# Patient Record
Sex: Female | Born: 1945 | Race: Black or African American | Hispanic: No | Smoking: Never smoker
Health system: Southern US, Community
[De-identification: ages and names within clinical notes are randomized; demographics above are authoritative.]

## PROBLEM LIST (undated history)

## (undated) DIAGNOSIS — C50411 Malignant neoplasm of upper-outer quadrant of right female breast: Principal | ICD-10-CM

## (undated) DIAGNOSIS — R232 Flushing: Secondary | ICD-10-CM

## (undated) DIAGNOSIS — I10 Essential (primary) hypertension: Secondary | ICD-10-CM

## (undated) DIAGNOSIS — T148XXA Other injury of unspecified body region, initial encounter: Secondary | ICD-10-CM

## (undated) DIAGNOSIS — E039 Hypothyroidism, unspecified: Secondary | ICD-10-CM

## (undated) DIAGNOSIS — E079 Disorder of thyroid, unspecified: Secondary | ICD-10-CM

## (undated) DIAGNOSIS — R112 Nausea with vomiting, unspecified: Secondary | ICD-10-CM

## (undated) DIAGNOSIS — K219 Gastro-esophageal reflux disease without esophagitis: Secondary | ICD-10-CM

## (undated) DIAGNOSIS — Z9889 Other specified postprocedural states: Secondary | ICD-10-CM

## (undated) DIAGNOSIS — C50919 Malignant neoplasm of unspecified site of unspecified female breast: Secondary | ICD-10-CM

## (undated) DIAGNOSIS — M1990 Unspecified osteoarthritis, unspecified site: Secondary | ICD-10-CM

## (undated) DIAGNOSIS — D219 Benign neoplasm of connective and other soft tissue, unspecified: Secondary | ICD-10-CM

## (undated) DIAGNOSIS — K5190 Ulcerative colitis, unspecified, without complications: Secondary | ICD-10-CM

## (undated) HISTORY — DX: Essential (primary) hypertension: I10

## (undated) HISTORY — DX: Flushing: R23.2

## (undated) HISTORY — DX: Unspecified osteoarthritis, unspecified site: M19.90

## (undated) HISTORY — DX: Malignant neoplasm of upper-outer quadrant of right female breast: C50.411

## (undated) HISTORY — DX: Malignant neoplasm of unspecified site of unspecified female breast: C50.919

## (undated) HISTORY — DX: Ulcerative colitis, unspecified, without complications: K51.90

## (undated) HISTORY — PX: OTHER SURGICAL HISTORY: SHX169

## (undated) HISTORY — DX: Disorder of thyroid, unspecified: E07.9

## (undated) HISTORY — DX: Other injury of unspecified body region, initial encounter: T14.8XXA

## (undated) HISTORY — PX: KNEE SURGERY: SHX244

## (undated) HISTORY — DX: Benign neoplasm of connective and other soft tissue, unspecified: D21.9

---

## 1992-05-26 HISTORY — PX: HYSTEROSCOPY: SHX211

## 1992-05-26 HISTORY — PX: DILATION AND CURETTAGE OF UTERUS: SHX78

## 1992-05-26 HISTORY — PX: VAGINAL HYSTERECTOMY: SUR661

## 1998-10-11 ENCOUNTER — Other Ambulatory Visit: Admission: RE | Admit: 1998-10-11 | Discharge: 1998-10-11 | Payer: Self-pay | Admitting: Obstetrics and Gynecology

## 1999-12-06 ENCOUNTER — Other Ambulatory Visit: Admission: RE | Admit: 1999-12-06 | Discharge: 1999-12-06 | Payer: Self-pay | Admitting: Obstetrics and Gynecology

## 2001-02-11 ENCOUNTER — Other Ambulatory Visit: Admission: RE | Admit: 2001-02-11 | Discharge: 2001-02-11 | Payer: Self-pay | Admitting: Obstetrics and Gynecology

## 2002-02-11 ENCOUNTER — Other Ambulatory Visit: Admission: RE | Admit: 2002-02-11 | Discharge: 2002-02-11 | Payer: Self-pay | Admitting: Obstetrics and Gynecology

## 2003-02-21 ENCOUNTER — Other Ambulatory Visit: Admission: RE | Admit: 2003-02-21 | Discharge: 2003-02-21 | Payer: Self-pay | Admitting: Obstetrics and Gynecology

## 2003-03-26 ENCOUNTER — Emergency Department (HOSPITAL_COMMUNITY): Admission: EM | Admit: 2003-03-26 | Discharge: 2003-03-27 | Payer: Self-pay | Admitting: Emergency Medicine

## 2003-09-05 ENCOUNTER — Ambulatory Visit (HOSPITAL_COMMUNITY): Admission: RE | Admit: 2003-09-05 | Discharge: 2003-09-05 | Payer: Self-pay | Admitting: Gastroenterology

## 2004-03-04 ENCOUNTER — Other Ambulatory Visit: Admission: RE | Admit: 2004-03-04 | Discharge: 2004-03-04 | Payer: Self-pay | Admitting: Obstetrics and Gynecology

## 2005-03-10 ENCOUNTER — Other Ambulatory Visit: Admission: RE | Admit: 2005-03-10 | Discharge: 2005-03-10 | Payer: Self-pay | Admitting: Obstetrics and Gynecology

## 2006-04-15 ENCOUNTER — Other Ambulatory Visit: Admission: RE | Admit: 2006-04-15 | Discharge: 2006-04-15 | Payer: Self-pay | Admitting: Obstetrics and Gynecology

## 2006-07-31 ENCOUNTER — Ambulatory Visit: Payer: Self-pay | Admitting: Vascular Surgery

## 2006-08-03 ENCOUNTER — Ambulatory Visit: Payer: Self-pay | Admitting: Vascular Surgery

## 2007-05-17 ENCOUNTER — Other Ambulatory Visit: Admission: RE | Admit: 2007-05-17 | Discharge: 2007-05-17 | Payer: Self-pay | Admitting: Obstetrics and Gynecology

## 2008-05-22 ENCOUNTER — Other Ambulatory Visit: Admission: RE | Admit: 2008-05-22 | Discharge: 2008-05-22 | Payer: Self-pay | Admitting: Obstetrics and Gynecology

## 2008-05-22 ENCOUNTER — Ambulatory Visit: Payer: Self-pay | Admitting: Obstetrics and Gynecology

## 2008-05-22 ENCOUNTER — Encounter: Payer: Self-pay | Admitting: Obstetrics and Gynecology

## 2008-05-26 DIAGNOSIS — T148XXA Other injury of unspecified body region, initial encounter: Secondary | ICD-10-CM

## 2008-05-26 HISTORY — DX: Other injury of unspecified body region, initial encounter: T14.8XXA

## 2008-08-01 ENCOUNTER — Encounter: Admission: RE | Admit: 2008-08-01 | Discharge: 2008-08-01 | Payer: Self-pay | Admitting: Emergency Medicine

## 2008-08-03 ENCOUNTER — Encounter: Admission: RE | Admit: 2008-08-03 | Discharge: 2008-08-03 | Payer: Self-pay | Admitting: Emergency Medicine

## 2009-04-23 ENCOUNTER — Encounter: Admission: RE | Admit: 2009-04-23 | Discharge: 2009-04-23 | Payer: Self-pay | Admitting: Pediatrics

## 2009-07-26 ENCOUNTER — Inpatient Hospital Stay (HOSPITAL_COMMUNITY): Admission: EM | Admit: 2009-07-26 | Discharge: 2009-07-31 | Payer: Self-pay | Admitting: Emergency Medicine

## 2009-07-26 ENCOUNTER — Ambulatory Visit: Payer: Self-pay | Admitting: Family Medicine

## 2009-10-09 ENCOUNTER — Ambulatory Visit: Payer: Self-pay | Admitting: Obstetrics and Gynecology

## 2009-10-09 ENCOUNTER — Other Ambulatory Visit
Admission: RE | Admit: 2009-10-09 | Discharge: 2009-10-09 | Payer: Self-pay | Source: Home / Self Care | Admitting: Obstetrics and Gynecology

## 2010-06-15 ENCOUNTER — Inpatient Hospital Stay (HOSPITAL_COMMUNITY)
Admission: EM | Admit: 2010-06-15 | Discharge: 2010-06-23 | Payer: Self-pay | Attending: Family Medicine | Admitting: Family Medicine

## 2010-06-16 ENCOUNTER — Encounter: Payer: Self-pay | Admitting: Pediatrics
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
## 2010-06-17 ENCOUNTER — Encounter: Payer: Self-pay | Admitting: Family Medicine

## 2010-06-18 LAB — DIFFERENTIAL
Basophils Absolute: 0.1 10*3/uL (ref 0.0–0.1)
Basophils Relative: 1 % (ref 0–1)
Lymphocytes Relative: 22 % (ref 12–46)
Monocytes Absolute: 1.6 10*3/uL — ABNORMAL HIGH (ref 0.1–1.0)
Neutro Abs: 7 10*3/uL (ref 1.7–7.7)
Neutrophils Relative %: 62 % (ref 43–77)

## 2010-06-18 LAB — ABO/RH: ABO/RH(D): AB POS

## 2010-06-18 LAB — CBC
HCT: 32 % — ABNORMAL LOW (ref 36.0–46.0)
HCT: 30.1 % — ABNORMAL LOW (ref 36.0–46.0)
HCT: 30.2 % — ABNORMAL LOW (ref 36.0–46.0)
Hemoglobin: 9.6 g/dL — ABNORMAL LOW (ref 12.0–15.0)
Hemoglobin: 9.2 g/dL — ABNORMAL LOW (ref 12.0–15.0)
Hemoglobin: 9.6 g/dL — ABNORMAL LOW (ref 12.0–15.0)
Hemoglobin: 9.8 g/dL — ABNORMAL LOW (ref 12.0–15.0)
MCH: 29.7 pg (ref 26.0–34.0)
MCH: 29 pg (ref 26.0–34.0)
MCH: 29.9 pg (ref 26.0–34.0)
MCHC: 31.8 g/dL (ref 30.0–36.0)
MCV: 92.2 fL (ref 78.0–100.0)
MCV: 90.9 fL (ref 78.0–100.0)
MCV: 91.3 fL (ref 78.0–100.0)
Platelets: 539 10*3/uL — ABNORMAL HIGH (ref 150–400)
Platelets: 553 10*3/uL — ABNORMAL HIGH (ref 150–400)
RBC: 3.47 MIL/uL — ABNORMAL LOW (ref 3.87–5.11)
RBC: 3.26 MIL/uL — ABNORMAL LOW (ref 3.87–5.11)
RBC: 3.34 MIL/uL — ABNORMAL LOW (ref 3.87–5.11)
RDW: 12.7 % (ref 11.5–15.5)
RDW: 13.4 % (ref 11.5–15.5)
RDW: 13.2 % (ref 11.5–15.5)
WBC: 8.5 10*3/uL (ref 4.0–10.5)
WBC: 13 10*3/uL — ABNORMAL HIGH (ref 4.0–10.5)
WBC: 9.2 10*3/uL (ref 4.0–10.5)

## 2010-06-18 LAB — COMPREHENSIVE METABOLIC PANEL
ALT: 17 U/L (ref 0–35)
CO2: 27 mEq/L (ref 19–32)
Chloride: 101 mEq/L (ref 96–112)
GFR calc non Af Amer: >60 mL/min (ref >60)
Total Bilirubin: 0.5 mg/dL (ref 0.3–1.2)

## 2010-06-18 LAB — GIARDIA/CRYPTOSPORIDIUM SCREEN(EIA): Cryptosporidium Screen (EIA): NEGATIVE

## 2010-06-18 LAB — TYPE AND SCREEN
ABO/RH(D): AB POS
Antibody Screen: NEGATIVE

## 2010-06-18 LAB — BASIC METABOLIC PANEL
BUN: 1 mg/dL — ABNORMAL LOW (ref 6–23)
BUN: 2 mg/dL — ABNORMAL LOW (ref 6–23)
Calcium: 8.2 mg/dL — ABNORMAL LOW (ref 8.4–10.5)
Calcium: 8.2 mg/dL — ABNORMAL LOW (ref 8.4–10.5)
Chloride: 104 mEq/L (ref 96–112)
GFR calc Af Amer: >60 mL/min (ref >60)
GFR calc non Af Amer: >60 mL/min (ref >60)
GFR calc non Af Amer: >60 mL/min (ref >60)
Glucose, Bld: 102 mg/dL — ABNORMAL HIGH (ref 70–99)
Glucose, Bld: 97 mg/dL (ref 70–99)
Potassium: 3.4 mEq/L — ABNORMAL LOW (ref 3.5–5.1)

## 2010-06-18 LAB — FECAL LACTOFERRIN, QUANT

## 2010-06-18 LAB — CLOSTRIDIUM DIFFICILE BY PCR: Toxigenic C. Difficile by PCR: NEGATIVE

## 2010-06-19 LAB — STOOL CULTURE

## 2010-06-19 LAB — CBC
HCT: 28.8 % — ABNORMAL LOW (ref 36.0–46.0)
HCT: 28.7 % — ABNORMAL LOW (ref 36.0–46.0)
Hemoglobin: 9.2 g/dL — ABNORMAL LOW (ref 12.0–15.0)
Hemoglobin: 9.3 g/dL — ABNORMAL LOW (ref 12.0–15.0)
MCH: 29.6 pg (ref 26.0–34.0)
MCH: 30.2 pg (ref 26.0–34.0)
MCHC: 32.7 g/dL (ref 30.0–36.0)
MCV: 91.2 fL (ref 78.0–100.0)
MCV: 91.1 fL (ref 78.0–100.0)
MCV: 92 fL (ref 78.0–100.0)
Platelets: 443 10*3/uL — ABNORMAL HIGH (ref 150–400)
Platelets: 465 10*3/uL — ABNORMAL HIGH (ref 150–400)
Platelets: 516 10*3/uL — ABNORMAL HIGH (ref 150–400)
RBC: 3.05 MIL/uL — ABNORMAL LOW (ref 3.87–5.11)
RBC: 3.16 MIL/uL — ABNORMAL LOW (ref 3.87–5.11)
RDW: 13.5 % (ref 11.5–15.5)
WBC: 8.9 10*3/uL (ref 4.0–10.5)
WBC: 9.4 10*3/uL (ref 4.0–10.5)

## 2010-06-19 LAB — BASIC METABOLIC PANEL
BUN: 1 mg/dL — ABNORMAL LOW (ref 6–23)
CO2: 24 mEq/L (ref 19–32)
Chloride: 108 mEq/L (ref 96–112)
GFR calc Af Amer: >60 mL/min (ref >60)
GFR calc non Af Amer: >60 mL/min (ref >60)
Potassium: 2.9 mEq/L — ABNORMAL LOW (ref 3.5–5.1)
Potassium: 3.6 mEq/L (ref 3.5–5.1)
Sodium: 136 mEq/L (ref 135–145)

## 2010-06-20 LAB — STOOL CULTURE

## 2010-06-20 LAB — CBC
HCT: 30.4 % — ABNORMAL LOW (ref 36.0–46.0)
Hemoglobin: 9.8 g/dL — ABNORMAL LOW (ref 12.0–15.0)
MCH: 29.9 pg (ref 26.0–34.0)
MCH: 29.5 pg (ref 26.0–34.0)
MCHC: 32.2 g/dL (ref 30.0–36.0)
MCHC: 32.2 g/dL (ref 30.0–36.0)
MCV: 92.7 fL (ref 78.0–100.0)
MCV: 91.7 fL (ref 78.0–100.0)
Platelets: 538 10*3/uL — ABNORMAL HIGH (ref 150–400)
RBC: 3.28 MIL/uL — ABNORMAL LOW (ref 3.87–5.11)
RBC: 3.25 MIL/uL — ABNORMAL LOW (ref 3.87–5.11)

## 2010-06-20 LAB — BASIC METABOLIC PANEL
BUN: 6 mg/dL (ref 6–23)
CO2: 26 mEq/L (ref 19–32)
Chloride: 106 mEq/L (ref 96–112)
Creatinine, Ser: 0.73 mg/dL (ref 0.4–1.2)
GFR calc Af Amer: >60 mL/min (ref >60)
Glucose, Bld: 159 mg/dL — ABNORMAL HIGH (ref 70–99)

## 2010-06-21 LAB — CBC
HCT: 28.6 % — ABNORMAL LOW (ref 36.0–46.0)
Hemoglobin: 9.9 g/dL — ABNORMAL LOW (ref 12.0–15.0)
MCHC: 32.2 g/dL (ref 30.0–36.0)
MCHC: 32.2 g/dL (ref 30.0–36.0)
Platelets: 525 10*3/uL — ABNORMAL HIGH (ref 150–400)
RDW: 14.3 % (ref 11.5–15.5)
RDW: 14.4 % (ref 11.5–15.5)
WBC: 28.7 10*3/uL — ABNORMAL HIGH (ref 4.0–10.5)

## 2010-06-21 LAB — BASIC METABOLIC PANEL
BUN: 15 mg/dL (ref 6–23)
Calcium: 8.9 mg/dL (ref 8.4–10.5)
GFR calc non Af Amer: >60 mL/min (ref >60)
Glucose, Bld: 132 mg/dL — ABNORMAL HIGH (ref 70–99)
Sodium: 140 mEq/L (ref 135–145)

## 2010-06-22 LAB — CBC
HCT: 31.4 % — ABNORMAL LOW (ref 36.0–46.0)
HCT: 30.2 % — ABNORMAL LOW (ref 36.0–46.0)
Hemoglobin: 9.9 g/dL — ABNORMAL LOW (ref 12.0–15.0)
Hemoglobin: 9.7 g/dL — ABNORMAL LOW (ref 12.0–15.0)
MCH: 30.3 pg (ref 26.0–34.0)
MCHC: 32.1 g/dL (ref 30.0–36.0)
MCV: 94 fL (ref 78.0–100.0)
Platelets: 544 10*3/uL — ABNORMAL HIGH (ref 150–400)
RBC: 3.04 MIL/uL — ABNORMAL LOW (ref 3.87–5.11)
RBC: 3.34 MIL/uL — ABNORMAL LOW (ref 3.87–5.11)
WBC: 23.8 10*3/uL — ABNORMAL HIGH (ref 4.0–10.5)
WBC: 26.5 10*3/uL — ABNORMAL HIGH (ref 4.0–10.5)

## 2010-06-22 LAB — BASIC METABOLIC PANEL
Chloride: 105 mEq/L (ref 96–112)
GFR calc Af Amer: >60 mL/min (ref >60)
Potassium: 4.1 mEq/L (ref 3.5–5.1)

## 2010-06-23 LAB — CBC
MCH: 30.2 pg (ref 26.0–34.0)
MCV: 93.9 fL (ref 78.0–100.0)
Platelets: 586 10*3/uL — ABNORMAL HIGH (ref 150–400)
RBC: 3.28 MIL/uL — ABNORMAL LOW (ref 3.87–5.11)

## 2010-07-02 ENCOUNTER — Other Ambulatory Visit: Payer: Self-pay | Admitting: Dermatology

## 2010-07-07 NOTE — Discharge Summary (Signed)
[name], [name]            ACCOUNT NO.:  [id number]  MEDICAL RECORD NO.:  [id number]          PATIENT TYPE:  INP  LOCATION:  5114                         FACILITY:  [facility]  PHYSICIAN:  Francisca Harbuck A. Sheffield Slider, M.D.    DATE OF BIRTH:  April 12, 1946  DATE OF ADMISSION:  06/15/2010 DATE OF DISCHARGE:  06/23/2010                              DISCHARGE SUMMARY   PRIMARY CARE PHYSICIAN:  [facility].  DISCHARGE DIAGNOSES: 1. Infectious and inflammatory colitis. 2. Diverticulosis. 3. Hypertension. 4. Asthma. 5. Hypokalemia. 6. Upper respiratory infection. 7. Hypothyroidism. 8. Anemia.  DISCHARGE MEDICATIONS: 1. Albuterol 2 puffs q.4 h. p.r.n. increased work of breathing. 2. Claritin 10 mg p.o. daily p.r.n. 3. Zofran 4 mg IV q.6 p.r.n. 4. Prednisone 60 mg p.o. daily before breakfast for 10 days. 5. Vancomycin 125 mg p.o. 4 times a day x5 days, end date February 2. 6. Calcium carbonate 7. Multivitamin. 8. Nexium 40 mg p.o. daily p.r.n. acid reflux. 9. Singulair 10 mg p.o. at bedtime. 10.Synthroid 112 mcg p.o. daily. 11.Vitamin D daily.  Medications that were stopped on discharge include: 1. HCTZ 25 mg p.o. daily. 2. Potassium chloride 20 mEq p.o. daily. 3. Norvasc 10 mg p.o. daily. 4. Celebrex 200 mg p.o. daily.  CONSULTS:  Dr. Dulce Sellar with [facility].  PROCEDURES: 1. Chest x-ray on January 21 showing no acute cardiopulmonary     abnormality. 2. CT abdomen and pelvis on January 21 showing abnormal wall     thickening of the transverse colon.  Primary considerations include     infectious colitis or inflammatory bowel disease.  Neoplasm is     difficult to completely exclude.  Prominent number and size of     lymph nodes in the gastrocolic ligament just cephalad to the     transverse colon appeared similar to prior examination, could be     reactive due to infection or inflammation.  Sigmoid colon is fairly     decompressed and not well distended with  contrast, limited     evaluation, no gross abnormalities, question gallbladder sludge     versus tiny gallstones, stable mid superior endplate compression     fracture of L2. 3. Flexible sigmoid colonoscopy on January 22 showing extensive     continuous colitis, overall appearance more typical of     pseudomembranous infectious colitis despite negative C. diff PCR.  LABORATORY DATA:  On admission, the patient's CBC was 11.8/9.6/30.1/553. The next morning it was 8.5/9.5/29.0/555.  Hemoglobin remained relatively stable ranging from 8.7-9.8.  On the day of discharge, hemoglobin was 9.9.  White count did increase with max of 28.7 after steroids initiated on day of discharge of 16.8.  CMET on admission showed hypokalemia of 2.9, otherwise within normal limits.  On date of discharge, BMET remained stable with a potassium improved to 4.1.  The patient was C. diff negative x2.  Stool was Giardia negative, Cryptosporidium negative.  No salmonella, shigella, campylobacter, in stool with fecal lactoferrin positive.  BRIEF HOSPITAL COURSE:  This is a 65 year old female presenting with bright red blood per rectum, abdominal pain, and anemia found to have infectious as well as  inflammatory colitis. 1. Colitis.  Dr. Dulce Sellar of gastroenterology was consulted and his     recommendations were greatly appreciated.  The patient was found to     have both infectious and inflammatory type changes and was     initially started on p.o. Cipro and Flagyl.  The patient continued     having diarrhea despite these antibiotics and was switched to p.o.     vancomycin.  The patient was C. diff negative x2, however, flex sig     did favor pseudomembranous colitis and thus we empirically treated     for this infectious colitis.  In addition, the patient was started     on IV Solu-Medrol for any inflammatory component which was     supported by colon biopsies done during flex sig showing     morphological features more  compatible with inflammatory bowel     disease, inflammation, and chronic injury present in each of the     biopsies inconsistent with an ulcerative proctocolitis pattern of     injury. CMV immunostain was pending and was found to be     negative.  The patient was transitioned to p.o. prednisone and will     complete a 10-day course.  The patient's p.o. vancomycin was a 10-     day course from January 24 to February 2.  Bright red blood quickly     improved once treatment was started, however, the patient's     diarrhea did persist.  On the day of discharge, diarrhea was     starting to improve and was not frequent as on admission.     Initially, the patient continued taking good p.o. without nausea or     vomiting and was advanced slowly from clears to soft mechanical to     a full regular diet.  The patient tolerated this well. 2. Anemia.  The patient's hemoglobin remained stable in the 9s and was     9.9 on discharge.  No workup was done as the source for anemia was     known to be the bright red blood per rectum.  The patient was     encouraged to take iron as an outpatient to help replenish her iron     stores.  The patient did not require any blood transfusions. 3. Hypertension.  The patient remained normotensive off of her home     medications.  Home HCTZ and Norvasc were held and the patient was     instructed not to restart them until seeing her primary care     physician as her blood pressures ranged from the 100-120. 4. Hypothyroidism.  The patient was continued on her home Synthroid. 5. Hypokalemia.  The patient has known chronic hypokalemia.  She was     initially found to have a potassium of 2.9 which was replenished     with IV potassium and she was then continued on her by mouth KCl.     This was not continued on discharge as the patient was instructed     not to continue her HCTZ until followup. 6. URI.  The patient came in with mostly upper respiratory symptoms     such  as cough which was bothersome to her.  She was continued on     her home Singulair as well as Claritin.  She was also given     Tessalon and albuterol p.r.n. and normal saline rinses.  No  albuterol was needed after initial treatment on admission and the     patient's cough had greatly improved by the time of discharge.     Chest x-ray on admission was clear.  This is not thought to be any     kind of infectious process. 7. Leukocytosis.  Initially the patient's white blood cell count was     within normal limits and then began increasing after the patient     was started on IV steroids.  This is likely the cause of the     leukocytosis.  The patient remained afebrile and did not have any     obvious sources of infection aside from her infectious colitis.     This could be followed up as an outpatient just to be certain that     her white blood cell count did return to normal.  FOLLOWUP: 1. The patient is to follow up with Dr. Dulce Sellar on June 28, 2010. 2. The patient was instructed to follow up at [facility] in 5-7 days after     discharge.  Things for [facility] to follow up on include checking the     patient's blood pressure and determining whether she needs to be     restarted on her home blood pressure medications as well as     rechecking CBC to follow her hemoglobin and white count and a BMET     to follow her potassium  DISCHARGE CONDITION:  The patient was discharged home in stable medical condition with close followup with her primary care physician and Dr. Dulce Sellar with GI.    ______________________________ Demetria Pore, MD   ______________________________ Arnette Norris. Sheffield Slider, M.D.    JM/MEDQ  D:  06/25/2010  T:  06/26/2010  Job:  [id number]  cc:   [facility] Willis Modena, MD  Electronically Signed by Demetria Pore MD on 06/28/2010 08:40:17 AM Electronically Signed by Zachery Dauer M.D. on 07/07/2010 03:14:06 PM
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
## 2010-08-16 LAB — HEPATIC FUNCTION PANEL
ALT: 11 U/L (ref 0–35)
Albumin: 2.8 g/dL — ABNORMAL LOW (ref 3.5–5.2)
Alkaline Phosphatase: 52 U/L (ref 39–117)
Total Bilirubin: 0.5 mg/dL (ref 0.3–1.2)

## 2010-08-16 LAB — CLOSTRIDIUM DIFFICILE EIA: C difficile Toxins A+B, EIA: NEGATIVE

## 2010-08-16 LAB — COMPREHENSIVE METABOLIC PANEL
ALT: 11 U/L (ref 0–35)
ALT: 22 U/L (ref 0–35)
AST: 12 U/L (ref 0–37)
Albumin: 3.1 g/dL — ABNORMAL LOW (ref 3.5–5.2)
Alkaline Phosphatase: 70 U/L (ref 39–117)
BUN: 3 mg/dL — ABNORMAL LOW (ref 6–23)
BUN: <1 mg/dL — ABNORMAL LOW (ref 6–23)
CO2: 27 mEq/L (ref 19–32)
Calcium: 8.2 mg/dL — ABNORMAL LOW (ref 8.4–10.5)
Chloride: 100 mEq/L (ref 96–112)
Creatinine, Ser: 0.65 mg/dL (ref 0.4–1.2)
GFR calc Af Amer: >60 mL/min (ref >60)
GFR calc non Af Amer: >60 mL/min (ref >60)
Glucose, Bld: 115 mg/dL — ABNORMAL HIGH (ref 70–99)
Potassium: 3.2 mEq/L — ABNORMAL LOW (ref 3.5–5.1)
Sodium: 134 mEq/L — ABNORMAL LOW (ref 135–145)
Sodium: 136 mEq/L (ref 135–145)
Total Bilirubin: 0.6 mg/dL (ref 0.3–1.2)
Total Protein: 6.3 g/dL (ref 6.0–8.3)

## 2010-08-16 LAB — DIFFERENTIAL
Basophils Absolute: 0.1 10*3/uL (ref 0.0–0.1)
Basophils Relative: 0 % (ref 0–1)
Eosinophils Absolute: 0.1 10*3/uL (ref 0.0–0.7)
Lymphs Abs: 1.8 10*3/uL (ref 0.7–4.0)
Neutrophils Relative %: 77 % (ref 43–77)

## 2010-08-16 LAB — CBC
HCT: 28.5 % — ABNORMAL LOW (ref 36.0–46.0)
HCT: 29 % — ABNORMAL LOW (ref 36.0–46.0)
HCT: 31.7 % — ABNORMAL LOW (ref 36.0–46.0)
Hemoglobin: 10.1 g/dL — ABNORMAL LOW (ref 12.0–15.0)
Hemoglobin: 9.6 g/dL — ABNORMAL LOW (ref 12.0–15.0)
Hemoglobin: 9.8 g/dL — ABNORMAL LOW (ref 12.0–15.0)
MCHC: 33.7 g/dL (ref 30.0–36.0)
MCHC: 34.2 g/dL (ref 30.0–36.0)
MCHC: 34.4 g/dL (ref 30.0–36.0)
MCV: 90.3 fL (ref 78.0–100.0)
MCV: 91.4 fL (ref 78.0–100.0)
MCV: 92 fL (ref 78.0–100.0)
Platelets: 463 10*3/uL — ABNORMAL HIGH (ref 150–400)
Platelets: 467 10*3/uL — ABNORMAL HIGH (ref 150–400)
RBC: 3.15 MIL/uL — ABNORMAL LOW (ref 3.87–5.11)
RBC: 3.17 MIL/uL — ABNORMAL LOW (ref 3.87–5.11)
RBC: 3.21 MIL/uL — ABNORMAL LOW (ref 3.87–5.11)
RDW: 13 % (ref 11.5–15.5)
RDW: 13 % (ref 11.5–15.5)
RDW: 13.3 % (ref 11.5–15.5)
WBC: 10.4 10*3/uL (ref 4.0–10.5)
WBC: 12.7 10*3/uL — ABNORMAL HIGH (ref 4.0–10.5)
WBC: 12.9 10*3/uL — ABNORMAL HIGH (ref 4.0–10.5)

## 2010-08-16 LAB — BASIC METABOLIC PANEL
BUN: 5 mg/dL — ABNORMAL LOW (ref 6–23)
CO2: 29 mEq/L (ref 19–32)
Calcium: 8.5 mg/dL (ref 8.4–10.5)
Chloride: 105 mEq/L (ref 96–112)
Chloride: 105 mEq/L (ref 96–112)
Creatinine, Ser: 0.6 mg/dL (ref 0.4–1.2)
GFR calc Af Amer: >60 mL/min (ref >60)
GFR calc Af Amer: >60 mL/min (ref >60)
GFR calc non Af Amer: >60 mL/min (ref >60)
Glucose, Bld: 124 mg/dL — ABNORMAL HIGH (ref 70–99)
Potassium: 3.3 mEq/L — ABNORMAL LOW (ref 3.5–5.1)
Potassium: 4.4 mEq/L (ref 3.5–5.1)
Sodium: 137 mEq/L (ref 135–145)
Sodium: 139 mEq/L (ref 135–145)

## 2010-08-16 LAB — STOOL CULTURE

## 2010-08-16 LAB — LIPASE, BLOOD: Lipase: 24 U/L (ref 11–59)

## 2010-08-16 LAB — GIARDIA/CRYPTOSPORIDIUM SCREEN(EIA)

## 2010-08-16 LAB — TSH: TSH: 0.65 u[IU]/mL (ref 0.350–4.500)

## 2010-08-16 LAB — FECAL LACTOFERRIN, QUANT

## 2011-01-08 ENCOUNTER — Encounter: Payer: Self-pay | Admitting: Gynecology

## 2011-01-08 DIAGNOSIS — D219 Benign neoplasm of connective and other soft tissue, unspecified: Secondary | ICD-10-CM | POA: Insufficient documentation

## 2011-01-08 DIAGNOSIS — T148XXA Other injury of unspecified body region, initial encounter: Secondary | ICD-10-CM | POA: Insufficient documentation

## 2011-01-08 DIAGNOSIS — I10 Essential (primary) hypertension: Secondary | ICD-10-CM | POA: Insufficient documentation

## 2011-01-21 ENCOUNTER — Encounter: Payer: Self-pay | Admitting: Obstetrics and Gynecology

## 2011-01-21 ENCOUNTER — Ambulatory Visit (INDEPENDENT_AMBULATORY_CARE_PROVIDER_SITE_OTHER): Payer: 59 | Admitting: Obstetrics and Gynecology

## 2011-01-21 ENCOUNTER — Other Ambulatory Visit (HOSPITAL_COMMUNITY)
Admission: RE | Admit: 2011-01-21 | Discharge: 2011-01-21 | Disposition: A | Discharge status: Home / Self Care | Payer: 59 | Source: Ambulatory Visit | Attending: Obstetrics and Gynecology | Admitting: Obstetrics and Gynecology

## 2011-01-21 VITALS — BP 136/80 | Ht 64.0 in | Wt 145.0 lb

## 2011-01-21 DIAGNOSIS — R823 Hemoglobinuria: Secondary | ICD-10-CM

## 2011-01-21 DIAGNOSIS — Z01419 Encounter for gynecological examination (general) (routine) without abnormal findings: Secondary | ICD-10-CM | POA: Insufficient documentation

## 2011-01-21 DIAGNOSIS — K5190 Ulcerative colitis, unspecified, without complications: Secondary | ICD-10-CM | POA: Insufficient documentation

## 2011-01-21 DIAGNOSIS — E079 Disorder of thyroid, unspecified: Secondary | ICD-10-CM | POA: Insufficient documentation

## 2011-01-21 IMAGING — CT CT ABD-PELV W/O CM
2 of 4 series · 17 of 46 positions shown, 19 images · non-contrast
Comparison: None.

CLINICAL DATA: Abdominal pain

CT ABDOMEN AND PELVIS WITHOUT CONTRAST
TECHNIQUE: Multidetector CT imaging of the abdomen and pelvis was
performed following the standard protocol without intravenous
contrast.

[Series 2: routine abdomen · axial · 0.74mm/px · z∈[-413,-48]mm · 14 of 81 slices shown, 16 images]
[im 4/81  soft-tissue]
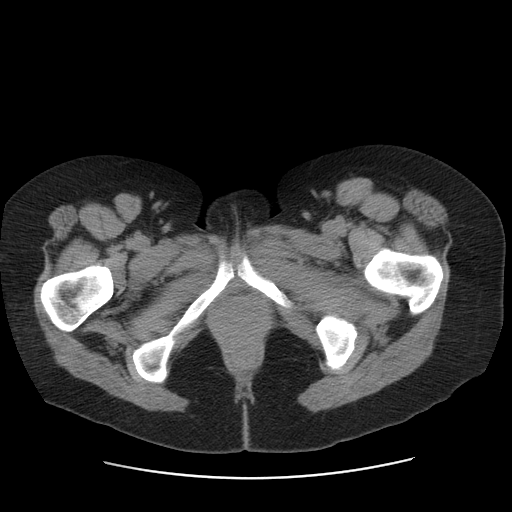
[im 4/81  bone]
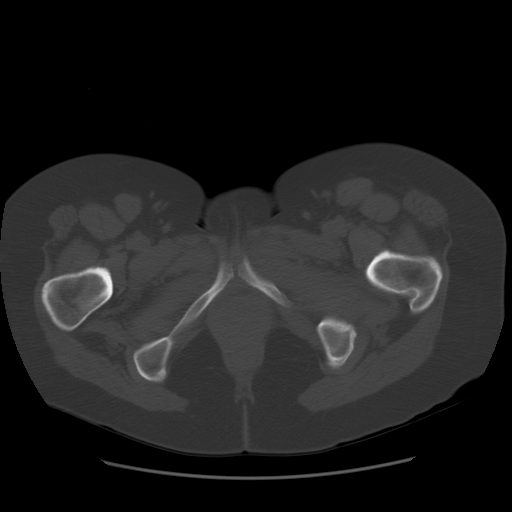
[im 10/81  soft-tissue]
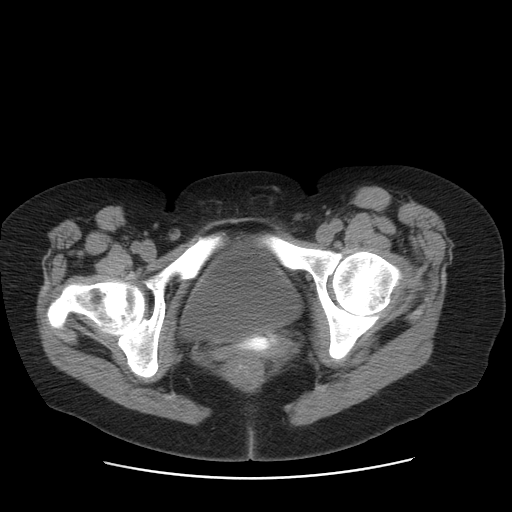
[im 17/81  soft-tissue]
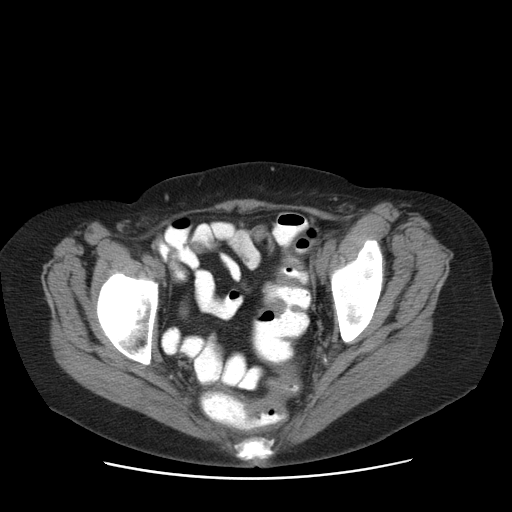
[im 23/81  soft-tissue]
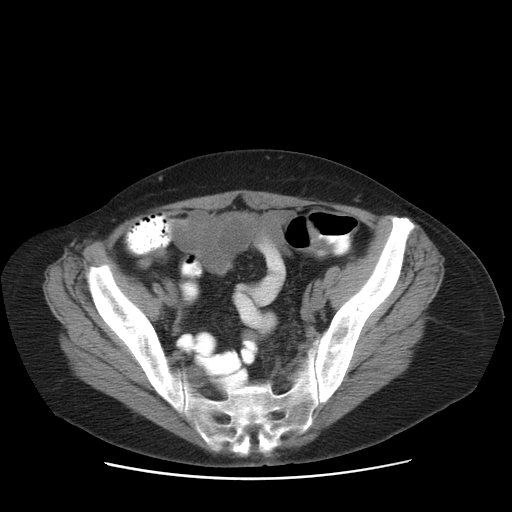
[im 26/81  soft-tissue]
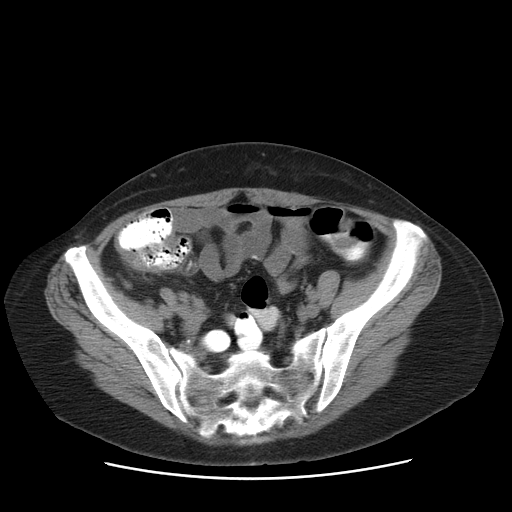
[im 33/81  soft-tissue]
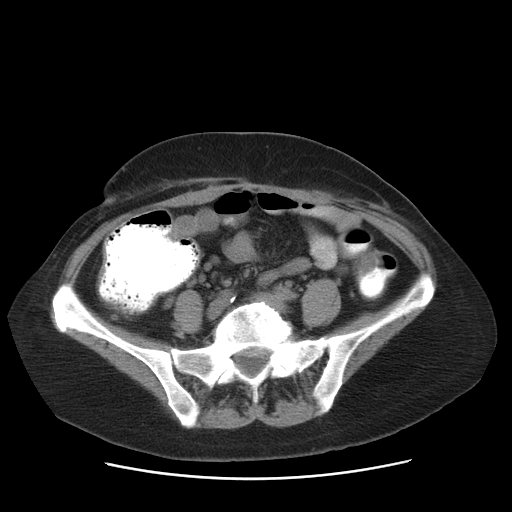
[im 39/81  soft-tissue]
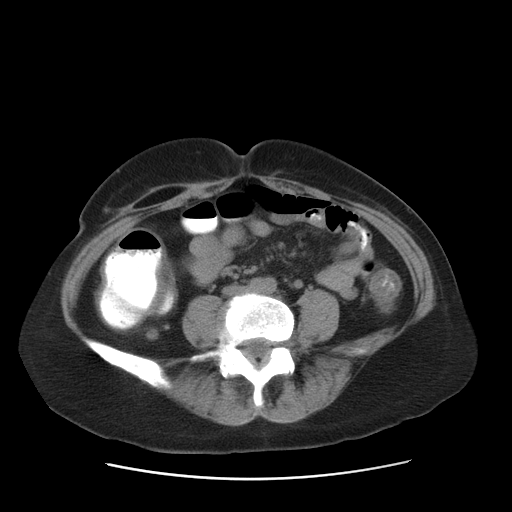
[im 42/81  soft-tissue]
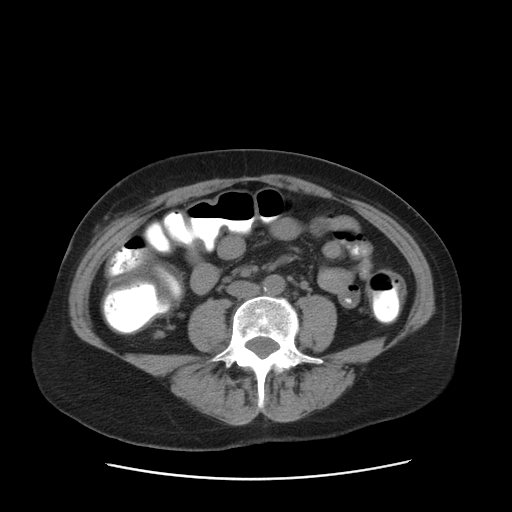
[im 49/81  soft-tissue]
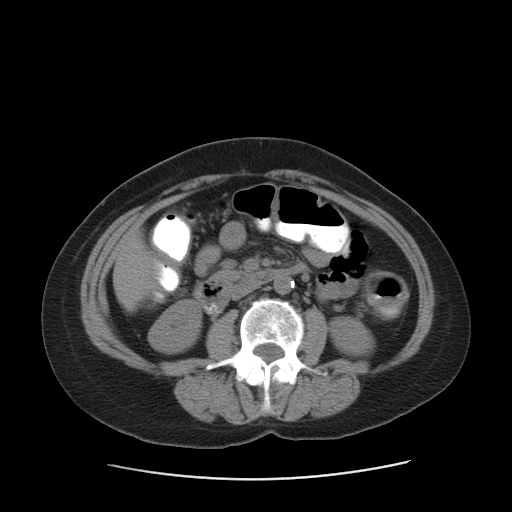
[im 49/81  bone]
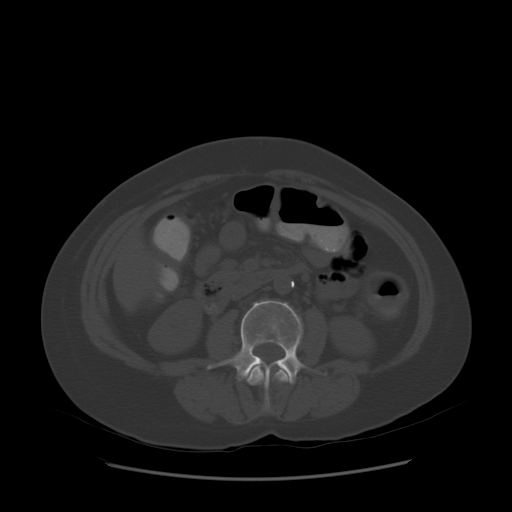
[im 55/81  soft-tissue]
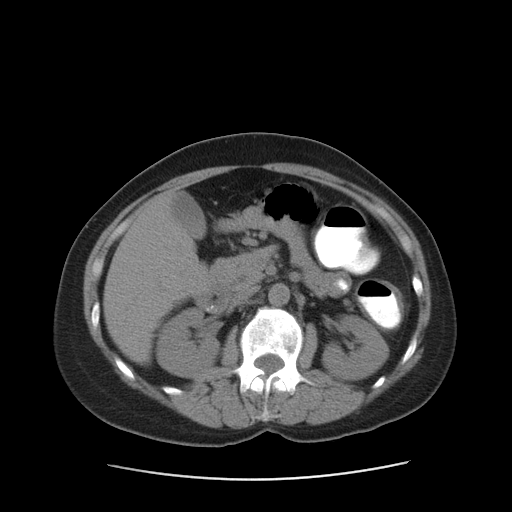
[im 61/81  soft-tissue]
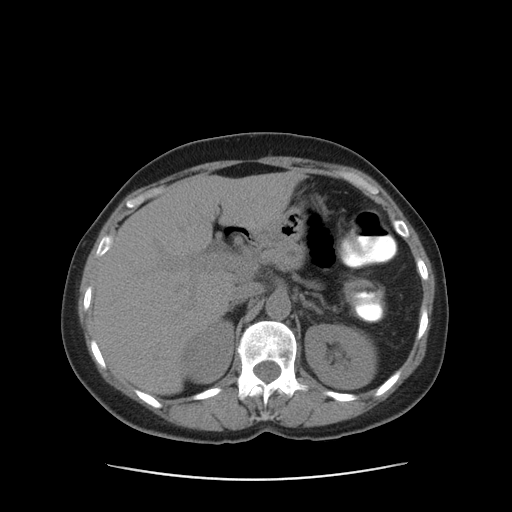
[im 65/81  soft-tissue]
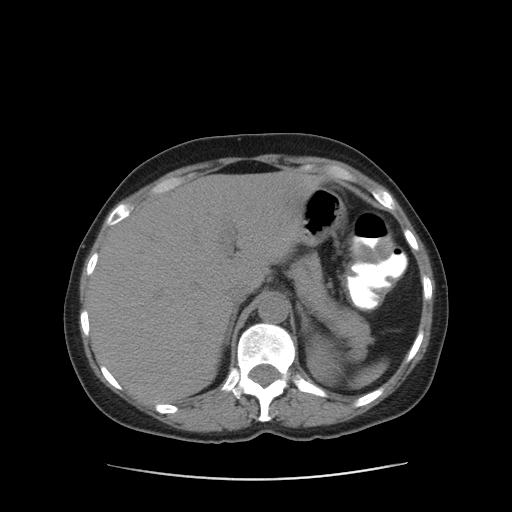
[im 71/81  soft-tissue]
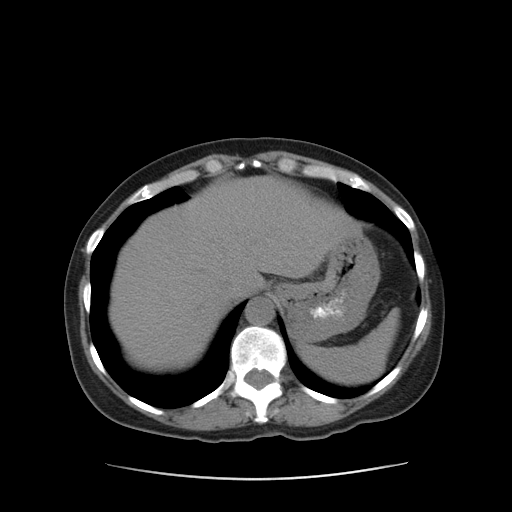
[im 77/81  soft-tissue]
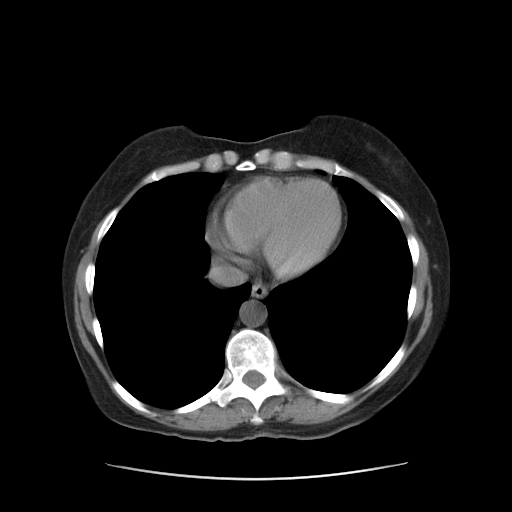

[Series 402: reformatted · coronal · 0.82mm/px · 3 of 86 slices shown]
[im 29/86  soft-tissue]
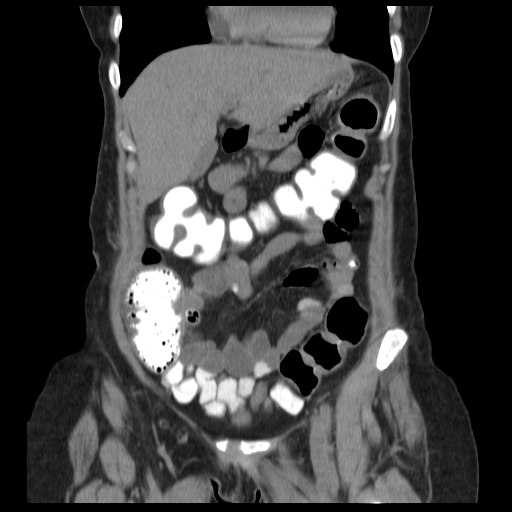
[im 38/86  soft-tissue]
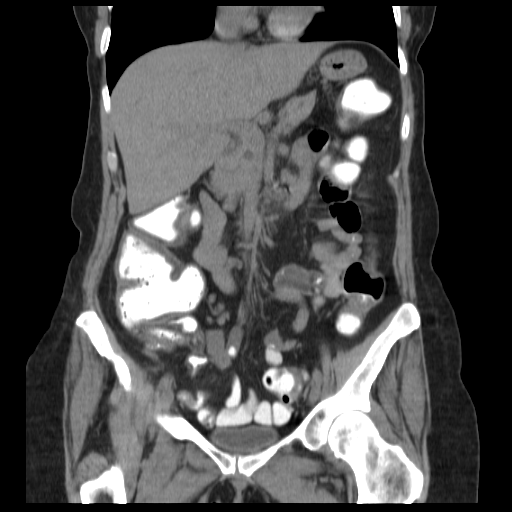
[im 48/86  soft-tissue]
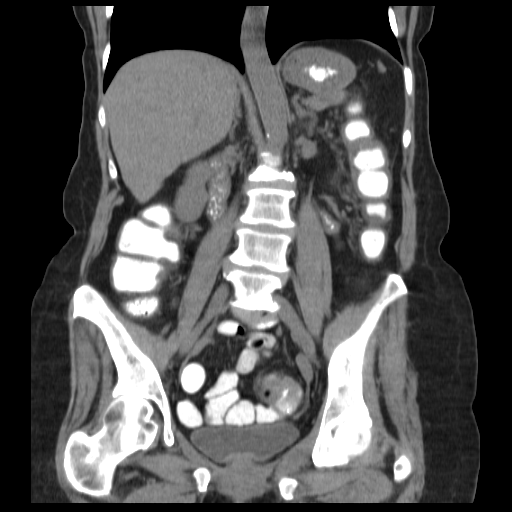

[17 of 46 positions shown; findings below may reference images not displayed]

FINDINGS: Lack of intravenous contrast severely limits the exam.

Wall thickening throughout the colon is present and is mild.
Normal appendix.  Gallbladder, liver, spleen, adrenal glands,
kidneys are within normal limits.  There is no obvious pancreatic
head mass.  The pancreatic duct is noted to be markedly distended
measuring up to 7 mm in caliber.  No evidence of biliary ductal
dilatation.  Several small lymph nodes are present in the small
bowel mesentery.  Not are definitively greater than 1 cm short axis
diameter.

No free fluid.  Mild L2 compression on the left side of
indeterminate age.
IMPRESSION: Mild diffuse colonic wall thickening.  Consider pan infectious
colitis, pseudomembranous colitis, or inflammatory bowel disease.

There is marked dilatation of the pancreatic duct.  The study is
limited due to lack of intravenous contrast.  No obvious mass can
be seen.  Underlying obstructive neoplasm in the head of the
pancreas or ampulla is not excluded.  Dedicated contrast-enhanced
CT of the abdomen or MRI is recommended.

Mild L2 compression of indeterminate age.

## 2011-01-21 NOTE — Progress Notes (Signed)
The patient came to see me today for her annual GYN exam. She is up-to-date on mammograms and bone densities. She sees Dr. Cleta Alberts who does her lab work. She was hospitalized this year for 8 days with severe colitis. She's been treated by [facility]. She is doing much better.  HEENT: Within normal limits. Neck: No masses. Supraclavicular lymph nodes: Not enlarged. Breasts: Examined in both sitting and lying position. Symmetrical without skin changes or masses. Abdomen: Soft no masses guarding or rebound. No hernias. Pelvic: External within normal limits. BUS within normal limits. Vaginal examination shows good estrogen effect, no cystocele enterocele or rectocele. Cervix and uterus absent. Adnexa within normal limits. Rectovaginal confirmatory. Extremities within normal limits.   Assessment: Normal GYN exam   Plan: Continue yearly mammos. Pt has appt.

## 2011-01-29 ENCOUNTER — Encounter: Payer: Self-pay | Admitting: Obstetrics and Gynecology

## 2011-06-03 ENCOUNTER — Ambulatory Visit (INDEPENDENT_AMBULATORY_CARE_PROVIDER_SITE_OTHER): Payer: 59 | Admitting: Emergency Medicine

## 2011-06-03 DIAGNOSIS — I10 Essential (primary) hypertension: Secondary | ICD-10-CM

## 2011-06-03 DIAGNOSIS — E049 Nontoxic goiter, unspecified: Secondary | ICD-10-CM

## 2011-06-03 DIAGNOSIS — E876 Hypokalemia: Secondary | ICD-10-CM

## 2011-07-01 ENCOUNTER — Other Ambulatory Visit: Payer: Self-pay | Admitting: Internal Medicine

## 2011-07-01 MED ORDER — AMLODIPINE BESYLATE 10 MG PO TABS: 10.0000 mg | ORAL_TABLET | Freq: Every day | ORAL | Status: DC

## 2011-09-30 ENCOUNTER — Ambulatory Visit (INDEPENDENT_AMBULATORY_CARE_PROVIDER_SITE_OTHER): Payer: 59 | Admitting: Emergency Medicine

## 2011-09-30 DIAGNOSIS — J309 Allergic rhinitis, unspecified: Secondary | ICD-10-CM

## 2011-09-30 DIAGNOSIS — I10 Essential (primary) hypertension: Secondary | ICD-10-CM

## 2011-09-30 DIAGNOSIS — E7800 Pure hypercholesterolemia, unspecified: Secondary | ICD-10-CM

## 2011-09-30 MED ORDER — ALBUTEROL SULFATE HFA 108 (90 BASE) MCG/ACT IN AERS: 2.0000 | INHALATION_SPRAY | Freq: Four times a day (QID) | RESPIRATORY_TRACT | Status: DC | PRN

## 2011-09-30 MED ORDER — MONTELUKAST SODIUM 10 MG PO TABS: 10.0000 mg | ORAL_TABLET | Freq: Every day | ORAL | Status: DC

## 2011-09-30 MED ORDER — ALBUTEROL SULFATE (2.5 MG/3ML) 0.083% IN NEBU: 2.5000 mg | INHALATION_SOLUTION | Freq: Once | RESPIRATORY_TRACT | Status: DC

## 2011-09-30 MED ORDER — FLUTICASONE PROPIONATE 50 MCG/ACT NA SUSP: 2.0000 | Freq: Every day | NASAL | Status: DC

## 2011-09-30 NOTE — Progress Notes (Signed)
  Subjective:    Patient ID: [name], female    DOB: 13-May-1946, 66 y.o.   MRN: [id number]  HPI This is a scheduled visit for [name]. She is in a regular recheck on her abdominal wall contusion thyroid disease and hypertension however a couple of weeks ago she decided to have her house painted she is a history of allergic rhinitis and asthma. She initially thought she was having trouble due to the pollen but then recognized it was due to the fumes of her house being painted. The picture was last in her home on Sunday she had severe difficulty breathing about 2 weeks ago  And borrowed her daughter's inhaler. She worked all day today she's had persistent cough over the last 10 days. She's been using the inhaler but did not get her Singulair felt. Zyrtec   Review of Systems she has a known large goiter and is scheduled for yearly visits with her ENT doctor. No surgery is planned for this.     Objective:   Physical Exam Physical exam reveals an alert female who is in no distress. Her HEENT exam is unremarkable. Neck reveals a large goiter which is multinodular. Chest exam initially revealed some basilar wheezes but breath sounds were symmetrical cardiac was regular rate and rhythm.exam reveals less of the soft tissue on the right with a linear 1 x 2 cm firm area on the left this has been felt before and was related to her abdominal wall contusion.       Assessment & Plan:  Patient had symptoms of allergic rhinitis and reactive airways disease. I did go ahead and give her nebulizer treatment. Following a nebulizer treatment her peak flow was 300 her pulse ox was 100 I did go ahead and refill her Singulair added Flonase and gave her a prescription for albuterol HFA to use 2 puffs every 4-6 hours as needed

## 2011-10-31 ENCOUNTER — Other Ambulatory Visit: Payer: Self-pay | Admitting: Internal Medicine

## 2011-12-11 IMAGING — CT CT ABD-PELV W/O CM
2 of 4 series · 12 of 32 positions shown, 17 images · non-contrast
Comparison: CT abdomen pelvis 07/26/2009

CLINICAL DATA: GI bleed of unknown source.

CT ABDOMEN AND PELVIS WITHOUT CONTRAST
TECHNIQUE: Multidetector CT imaging of the abdomen and pelvis was
performed following the standard protocol without intravenous
contrast.

[Series 2: routine abdomen · axial · 0.79mm/px · z∈[-436,-116]mm · 8 of 84 slices shown, 13 images]
[im 10/84  soft-tissue]
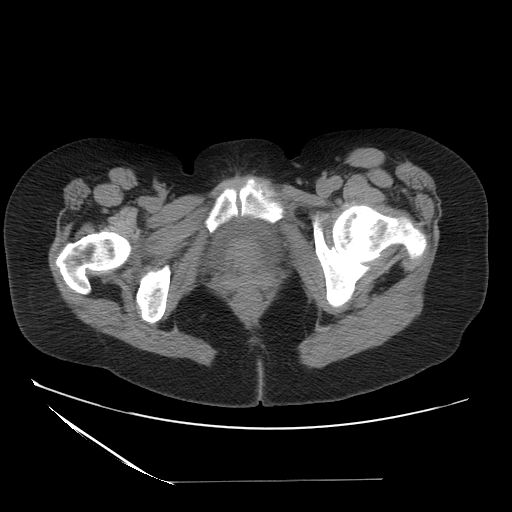
[im 10/84  bone]
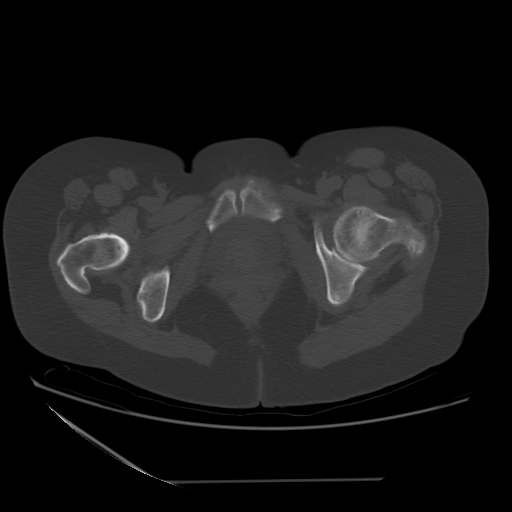
[im 19/84  soft-tissue]
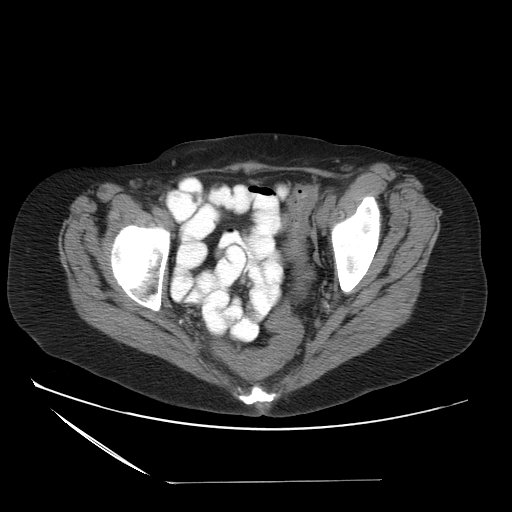
[im 28/84  soft-tissue]
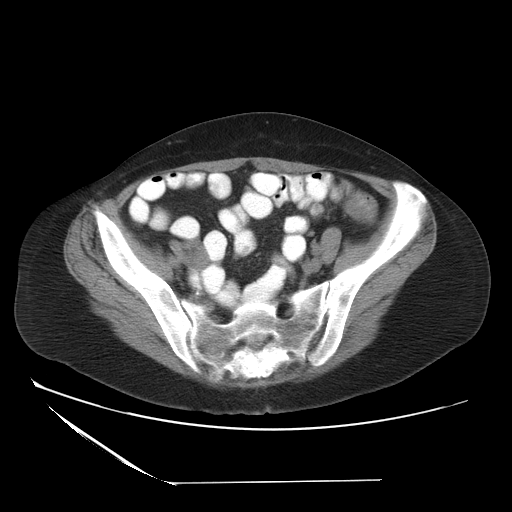
[im 37/84  soft-tissue]
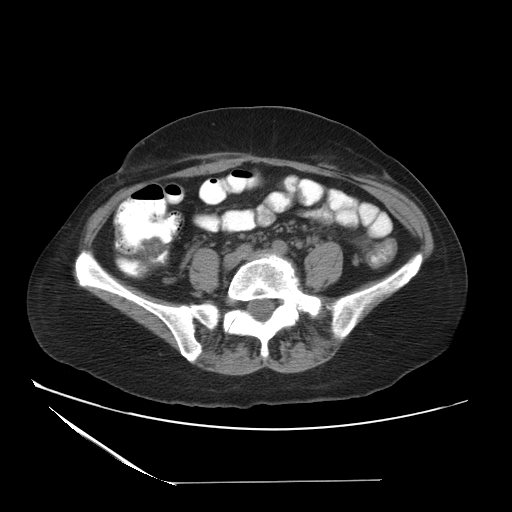
[im 47/84  soft-tissue]
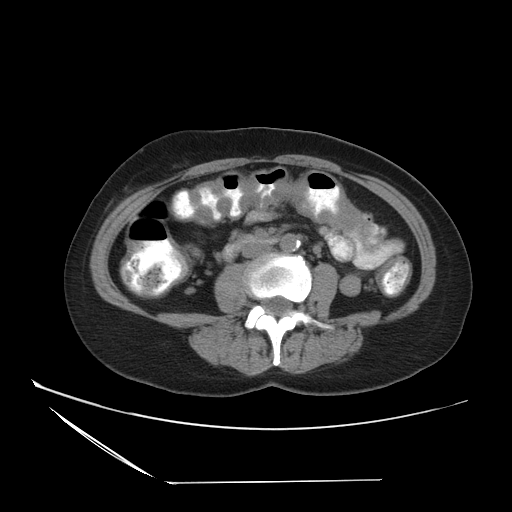
[im 47/84  lung]
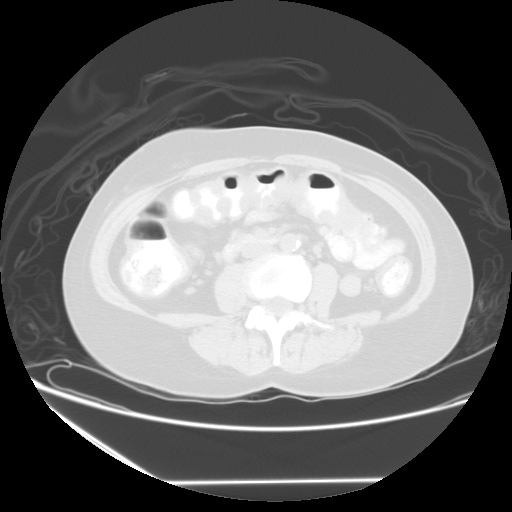
[im 56/84  soft-tissue]
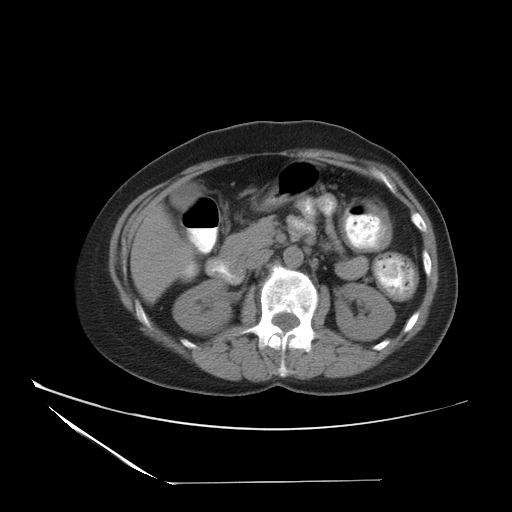
[im 56/84  lung]
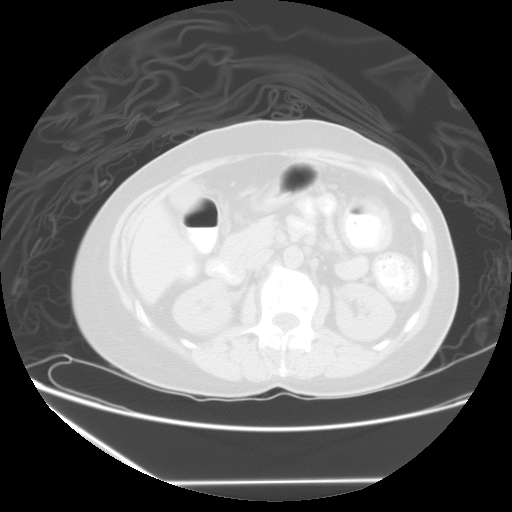
[im 65/84  soft-tissue]
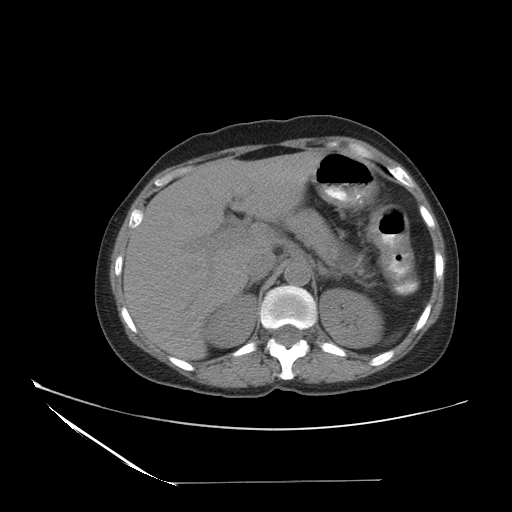
[im 65/84  lung]
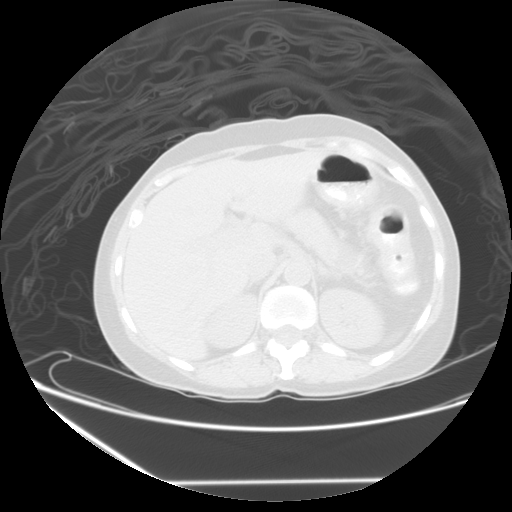
[im 74/84  soft-tissue]
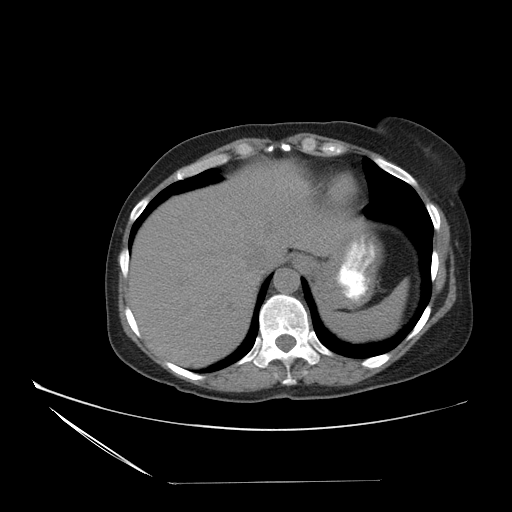
[im 74/84  lung]
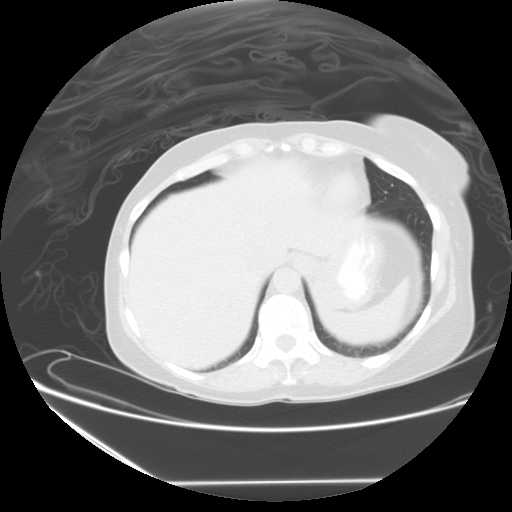

[Series 401: sag · sagittal · 0.84mm/px · 4 of 98 slices shown]
[im 10/98  soft-tissue]
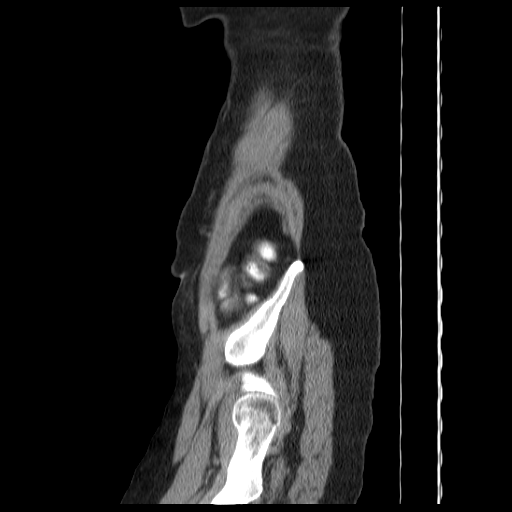
[im 20/98  soft-tissue]
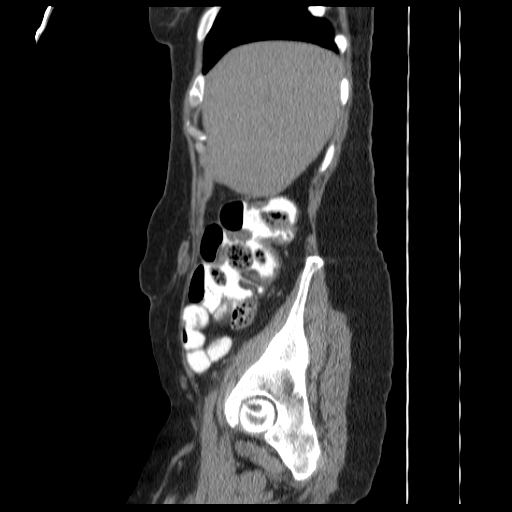
[im 30/98  soft-tissue]
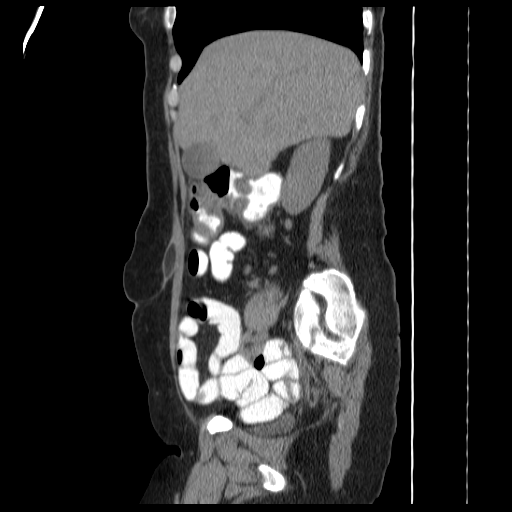
[im 39/98  soft-tissue]
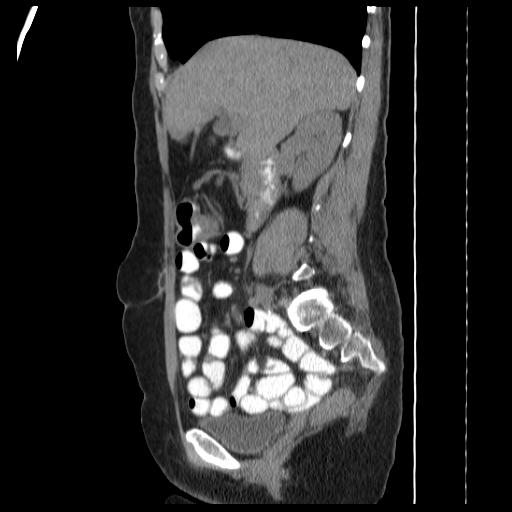

[12 of 32 positions shown; findings below may reference images not displayed]

FINDINGS: The lung bases are clear.  Heart size is normal.

Lack of intravenous contrast limits sensitivity for detecting many
abnormalities, other than urinary tract calculi.

Oral contrast is present within the stomach, small bowel loops, and
the colon, with only a small amount of oral contrast present in the
sigmoid colon and rectum at the time of the exam.

The stomach appears within normal limits.  Small bowel loops are
normal in caliber and wall thickness.  Terminal ileum appears
normal.  The appendix is normal.

There is abnormal wall thickening of portions of the transverse
colon (images 34 through 41).  There is lymphadenopathy in the
gastrocolic ligament.  Lymph nodes are increased in number and some
are prominent in size.  These lymph nodes appears similar compared
to the exam of July 2009.  Lung the largest lymph nodes is 12 mm
short axis on image number 33.

The sigmoid colon is not very distended at the time of the
examination and contains minimal oral contrast.  There is  no
definite sigmoid wall thickening, but is difficult to completely
exclude.

Lymph nodes  in the right lower quadrant ileocolic mesentery are
prominent in number but not pathologically enlarged.  These also
appears similar compared to prior examination.

Noncontrast appearance of the liver is stable.  Focal low
attenuation near the falciform ligament likely focal fatty
deposition and is unchanged.  Vague layering high density within
the gallbladder suggests the presence of sludge or tiny stones.
The spleen, adrenal glands, kidneys, and pancreas are within normal
limits.

No urinary tract calculi identified.

Rectum is unremarkable.  Urinary bladder, prostate gland, and
seminal vesicles are within normal limits.  There is no  evidence
of abscess or free intraperitoneal air.  There is some scarring in
the anterior abdominal wall.

Mild superior endplate compression fracture of the L2 vertebral
body is stable.  Degenerative disc disease at L5 S1, with a central
disc bulge at this level is unchanged.  No suspicious osseous
abnormality.

The abdominal aorta is normal in caliber contains scattered
atherosclerotic calcification.
IMPRESSION: 1.  The transverse colon demonstrates abnormal wall thickening.
Primary considerations include infectious colitis or inflammatory
bowel disease.  Neoplasm is difficult to completely exclude.
Consider follow-up colonoscopy for further evaluation.
2.  Prominent number and size of lymph nodes in the gastrocolic
ligament just cephalad to the transverse colon appear similar to
prior examination and could be reactive due to infection or
inflammation, but involvement with secondary neoplasm is difficult
to completely exclude.
3.  Sigmoid colon is fairly decompressed and not well distended
with contrast, limiting evaluation.  No gross abnormalities
identified.
4.  Question gallbladder sludge versus tiny gallstones.
5.  Stable mild superior endplate compression fracture of L2.

## 2011-12-30 ENCOUNTER — Ambulatory Visit (INDEPENDENT_AMBULATORY_CARE_PROVIDER_SITE_OTHER): Payer: 59 | Admitting: Emergency Medicine

## 2011-12-30 ENCOUNTER — Encounter: Payer: Self-pay | Admitting: Emergency Medicine

## 2011-12-30 VITALS — BP 124/78 | HR 86 | Temp 98.2°F | Resp 16 | Ht 64.0 in | Wt 152.0 lb

## 2011-12-30 DIAGNOSIS — R1084 Generalized abdominal pain: Secondary | ICD-10-CM

## 2011-12-30 DIAGNOSIS — I10 Essential (primary) hypertension: Secondary | ICD-10-CM

## 2011-12-30 MED ORDER — AMLODIPINE BESYLATE 10 MG PO TABS: 10.0000 mg | ORAL_TABLET | Freq: Every day | ORAL | Status: DC
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
## 2012-04-21 ENCOUNTER — Encounter: Payer: Self-pay | Admitting: Obstetrics and Gynecology

## 2012-05-11 ENCOUNTER — Ambulatory Visit (INDEPENDENT_AMBULATORY_CARE_PROVIDER_SITE_OTHER): Payer: 59 | Admitting: Emergency Medicine

## 2012-05-11 VITALS — BP 116/71 | HR 85 | Resp 18 | Ht 64.0 in | Wt 153.0 lb

## 2012-05-11 DIAGNOSIS — E039 Hypothyroidism, unspecified: Secondary | ICD-10-CM

## 2012-05-11 DIAGNOSIS — I10 Essential (primary) hypertension: Secondary | ICD-10-CM

## 2012-05-11 LAB — CBC WITH DIFFERENTIAL/PLATELET
Basophils Absolute: 0 10*3/uL (ref 0.0–0.1)
Basophils Relative: 0 % (ref 0–1)
Eosinophils Relative: 0 % (ref 0–5)
Hemoglobin: 12.5 g/dL (ref 12.0–15.0)
Lymphs Abs: 2.6 10*3/uL (ref 0.7–4.0)
MCH: 28.4 pg (ref 26.0–34.0)
Monocytes Absolute: 0.6 10*3/uL (ref 0.1–1.0)
Neutro Abs: 7.9 10*3/uL — ABNORMAL HIGH (ref 1.7–7.7)
Neutrophils Relative %: 72 % (ref 43–77)
Platelets: 459 10*3/uL — ABNORMAL HIGH (ref 150–400)
RBC: 4.4 MIL/uL (ref 3.87–5.11)
WBC: 11.1 10*3/uL — ABNORMAL HIGH (ref 4.0–10.5)

## 2012-05-11 LAB — BASIC METABOLIC PANEL
BUN: 16 mg/dL (ref 6–23)
Chloride: 104 mEq/L (ref 96–112)
Creat: 0.68 mg/dL (ref 0.50–1.10)
Glucose, Bld: 114 mg/dL — ABNORMAL HIGH (ref 70–99)

## 2012-05-11 LAB — T4, FREE: Free T4: 1.51 ng/dL (ref 0.80–1.80)

## 2012-05-11 MED ORDER — HYDROCHLOROTHIAZIDE 25 MG PO TABS: 25.0000 mg | ORAL_TABLET | Freq: Every day | ORAL | Status: DC

## 2012-05-11 MED ORDER — POTASSIUM CHLORIDE ER 10 MEQ PO TBCR: 10.0000 meq | EXTENDED_RELEASE_TABLET | Freq: Two times a day (BID) | ORAL | Status: DC

## 2012-05-11 MED ORDER — LEVOTHYROXINE SODIUM 112 MCG PO TABS: 112.0000 ug | ORAL_TABLET | Freq: Every day | ORAL | Status: DC

## 2012-05-11 NOTE — Progress Notes (Signed)
  Subjective:    Patient ID: [name], female    DOB: Feb 20, 1946, 66 y.o.   MRN: [id number]  HPI patient had a followup hypothyroidism and hypertension. She is also under the care of Dr. Dulce Sellar for colitis. This with him and is doing well on her lialda. She is taking her blood pressure medication and diuretics and potassium supplement.    Review of Systems     Objective:   Physical Exam patient looks great in no distress. She has a large multinodular goiter. Her chest is clear. Cardiac unremarkable with a regular rate and rhythm no murmurs rubs or gallops. Abdomen is soft with a soft tissue prominence on the right        Assessment & Plan:  Patient stable on current medication regimen. She was advised to make a followup appointment with her ENT to followup on her goiter. Routine labs were done today to check her renal function. A thyroid test was also done. Her blood pressure medicines, potassium ,and Synthroid were refilled.

## 2012-05-11 NOTE — Patient Instructions (Signed)
Return in 6 months

## 2012-05-12 ENCOUNTER — Encounter: Payer: Self-pay | Admitting: Obstetrics and Gynecology

## 2012-05-12 ENCOUNTER — Ambulatory Visit (INDEPENDENT_AMBULATORY_CARE_PROVIDER_SITE_OTHER): Payer: 59 | Admitting: Obstetrics and Gynecology

## 2012-05-12 VITALS — BP 118/76 | Ht 64.0 in | Wt 152.0 lb

## 2012-05-12 DIAGNOSIS — Z01419 Encounter for gynecological examination (general) (routine) without abnormal findings: Secondary | ICD-10-CM
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
## 2012-05-12 NOTE — Patient Instructions (Signed)
Continue yearly mammograms 

## 2012-05-13 LAB — URINALYSIS W MICROSCOPIC + REFLEX CULTURE
Bacteria, UA: NONE SEEN
Bilirubin Urine: NEGATIVE
Casts: NONE SEEN
Crystals: NONE SEEN
Hgb urine dipstick: NEGATIVE
Ketones, ur: NEGATIVE mg/dL
Nitrite: NEGATIVE
pH: 5 (ref 5.0–8.0)

## 2012-05-17 ENCOUNTER — Telehealth: Payer: Self-pay

## 2012-05-17 NOTE — Telephone Encounter (Signed)
KIM STATES WE REFERRED PT OVER AND THEY ARE IN NEED OF HER RECORDS PLEASE FAX TO [phone] AND THE PHONE NUMBER IS [phone]

## 2012-05-18 NOTE — Telephone Encounter (Signed)
Last office notes sent to [facility].

## 2012-06-01 ENCOUNTER — Encounter: Payer: Self-pay | Admitting: Obstetrics and Gynecology

## 2012-06-10 DIAGNOSIS — G589 Mononeuropathy, unspecified: Secondary | ICD-10-CM | POA: Diagnosis not present

## 2012-06-10 DIAGNOSIS — M2040 Other hammer toe(s) (acquired), unspecified foot: Secondary | ICD-10-CM | POA: Diagnosis not present

## 2012-06-26 ENCOUNTER — Other Ambulatory Visit: Payer: Self-pay | Admitting: Emergency Medicine

## 2012-07-10 ENCOUNTER — Other Ambulatory Visit: Payer: Self-pay | Admitting: Emergency Medicine

## 2012-07-23 ENCOUNTER — Other Ambulatory Visit: Payer: Self-pay | Admitting: Physician Assistant

## 2012-11-03 DIAGNOSIS — E040 Nontoxic diffuse goiter: Secondary | ICD-10-CM | POA: Diagnosis not present

## 2012-11-04 DIAGNOSIS — E041 Nontoxic single thyroid nodule: Secondary | ICD-10-CM | POA: Diagnosis not present

## 2012-11-04 DIAGNOSIS — E042 Nontoxic multinodular goiter: Secondary | ICD-10-CM | POA: Diagnosis not present

## 2012-11-09 ENCOUNTER — Encounter: Payer: Self-pay | Admitting: Emergency Medicine

## 2012-11-09 ENCOUNTER — Ambulatory Visit (INDEPENDENT_AMBULATORY_CARE_PROVIDER_SITE_OTHER): Payer: Medicare Other | Admitting: Emergency Medicine

## 2012-11-09 VITALS — BP 129/84 | HR 75 | Temp 97.4°F | Resp 16 | Ht 63.5 in | Wt 150.8 lb

## 2012-11-09 DIAGNOSIS — I10 Essential (primary) hypertension: Secondary | ICD-10-CM

## 2012-11-09 DIAGNOSIS — E039 Hypothyroidism, unspecified: Secondary | ICD-10-CM | POA: Diagnosis not present

## 2012-11-09 LAB — COMPREHENSIVE METABOLIC PANEL
ALT: 12 U/L (ref 0–35)
AST: 13 U/L (ref 0–37)
Alkaline Phosphatase: 70 U/L (ref 39–117)
CO2: 28 mEq/L (ref 19–32)
Creat: 0.59 mg/dL (ref 0.50–1.10)
Sodium: 140 mEq/L (ref 135–145)
Total Bilirubin: 0.6 mg/dL (ref 0.3–1.2)
Total Protein: 8 g/dL (ref 6.0–8.3)

## 2012-11-09 LAB — CBC WITH DIFFERENTIAL/PLATELET
Basophils Absolute: 0 10*3/uL (ref 0.0–0.1)
Basophils Relative: 0 % (ref 0–1)
HCT: 39.7 % (ref 36.0–46.0)
Lymphocytes Relative: 28 % (ref 12–46)
MCHC: 33.2 g/dL (ref 30.0–36.0)
Neutro Abs: 4.8 10*3/uL (ref 1.7–7.7)
Neutrophils Relative %: 65 % (ref 43–77)
Platelets: 453 10*3/uL — ABNORMAL HIGH (ref 150–400)
RDW: 13.9 % (ref 11.5–15.5)
WBC: 7.4 10*3/uL (ref 4.0–10.5)

## 2012-11-09 LAB — TSH: TSH: 0.692 u[IU]/mL (ref 0.350–4.500)

## 2012-11-09 NOTE — Progress Notes (Signed)
  Subjective:    Patient ID: [name], female    DOB: 04-09-1946, 67 y.o.   MRN: [id number]  HPI patient here for recheck. She is on blood pressure medicines and potassium. Her GI tract has been stable. She continues to struggle with orthopedic issues. She recently had an ultrasound done of her thyroid to followup with Dr. Robbie Louis    Review of Systems     Objective:   Physical Exam patient is alert and cooperative in no distress. Her neck is supple with enlarged multinodular goiter. Chest is clear to auscultation and percussion. Heart is a regular rate without murmurs. The abdomen has an asymmetrical contour. No definite masses felt. Extremities are without edema .       Assessment & Plan:  No change meds and recheck in October she will let me know the decision regarding her need for thyroidectomy or not .

## 2013-01-08 ENCOUNTER — Other Ambulatory Visit: Payer: Self-pay | Admitting: Emergency Medicine

## 2013-02-17 DIAGNOSIS — R197 Diarrhea, unspecified: Secondary | ICD-10-CM | POA: Diagnosis not present

## 2013-03-03 DIAGNOSIS — K5190 Ulcerative colitis, unspecified, without complications: Secondary | ICD-10-CM | POA: Diagnosis not present

## 2013-03-12 ENCOUNTER — Inpatient Hospital Stay (HOSPITAL_COMMUNITY)
Admission: AD | Admit: 2013-03-12 | Discharge: 2013-03-16 | DRG: 493 | Disposition: A | Discharge status: Skilled Nursing Facility | Payer: Medicare Other | Source: Ambulatory Visit | Attending: Family Medicine | Admitting: Family Medicine

## 2013-03-12 DIAGNOSIS — J309 Allergic rhinitis, unspecified: Secondary | ICD-10-CM | POA: Diagnosis present

## 2013-03-12 DIAGNOSIS — E039 Hypothyroidism, unspecified: Secondary | ICD-10-CM | POA: Diagnosis not present

## 2013-03-12 DIAGNOSIS — E049 Nontoxic goiter, unspecified: Secondary | ICD-10-CM | POA: Diagnosis present

## 2013-03-12 DIAGNOSIS — R269 Unspecified abnormalities of gait and mobility: Secondary | ICD-10-CM | POA: Diagnosis not present

## 2013-03-12 DIAGNOSIS — M1990 Unspecified osteoarthritis, unspecified site: Secondary | ICD-10-CM | POA: Diagnosis present

## 2013-03-12 DIAGNOSIS — W06XXXA Fall from bed, initial encounter: Secondary | ICD-10-CM | POA: Diagnosis present

## 2013-03-12 DIAGNOSIS — Z4789 Encounter for other orthopedic aftercare: Secondary | ICD-10-CM | POA: Diagnosis not present

## 2013-03-12 DIAGNOSIS — Z79899 Other long term (current) drug therapy: Secondary | ICD-10-CM | POA: Diagnosis not present

## 2013-03-12 DIAGNOSIS — G8918 Other acute postprocedural pain: Secondary | ICD-10-CM | POA: Diagnosis not present

## 2013-03-12 DIAGNOSIS — Z9181 History of falling: Secondary | ICD-10-CM | POA: Diagnosis not present

## 2013-03-12 DIAGNOSIS — Z7982 Long term (current) use of aspirin: Secondary | ICD-10-CM

## 2013-03-12 DIAGNOSIS — I10 Essential (primary) hypertension: Secondary | ICD-10-CM | POA: Diagnosis not present

## 2013-03-12 DIAGNOSIS — K5190 Ulcerative colitis, unspecified, without complications: Secondary | ICD-10-CM | POA: Diagnosis present

## 2013-03-12 DIAGNOSIS — M6281 Muscle weakness (generalized): Secondary | ICD-10-CM | POA: Diagnosis not present

## 2013-03-12 DIAGNOSIS — S8263XA Displaced fracture of lateral malleolus of unspecified fibula, initial encounter for closed fracture: Secondary | ICD-10-CM | POA: Diagnosis not present

## 2013-03-12 DIAGNOSIS — K219 Gastro-esophageal reflux disease without esophagitis: Secondary | ICD-10-CM | POA: Diagnosis not present

## 2013-03-12 DIAGNOSIS — T148XXA Other injury of unspecified body region, initial encounter: Secondary | ICD-10-CM | POA: Diagnosis not present

## 2013-03-12 DIAGNOSIS — S82899A Other fracture of unspecified lower leg, initial encounter for closed fracture: Principal | ICD-10-CM | POA: Diagnosis present

## 2013-03-12 DIAGNOSIS — E079 Disorder of thyroid, unspecified: Secondary | ICD-10-CM | POA: Diagnosis present

## 2013-03-12 DIAGNOSIS — M4120 Other idiopathic scoliosis, site unspecified: Secondary | ICD-10-CM | POA: Diagnosis present

## 2013-03-12 DIAGNOSIS — M79609 Pain in unspecified limb: Secondary | ICD-10-CM | POA: Diagnosis not present

## 2013-03-12 DIAGNOSIS — S8290XD Unspecified fracture of unspecified lower leg, subsequent encounter for closed fracture with routine healing: Secondary | ICD-10-CM | POA: Diagnosis not present

## 2013-03-12 DIAGNOSIS — G5790 Unspecified mononeuropathy of unspecified lower limb: Secondary | ICD-10-CM | POA: Diagnosis present

## 2013-03-12 DIAGNOSIS — E876 Hypokalemia: Secondary | ICD-10-CM | POA: Diagnosis present

## 2013-03-12 DIAGNOSIS — S82409A Unspecified fracture of shaft of unspecified fibula, initial encounter for closed fracture: Secondary | ICD-10-CM | POA: Diagnosis not present

## 2013-03-12 DIAGNOSIS — Z23 Encounter for immunization: Secondary | ICD-10-CM | POA: Diagnosis not present

## 2013-03-12 DIAGNOSIS — S82401A Unspecified fracture of shaft of right fibula, initial encounter for closed fracture: Secondary | ICD-10-CM

## 2013-03-12 DIAGNOSIS — S82401D Unspecified fracture of shaft of right fibula, subsequent encounter for closed fracture with routine healing: Secondary | ICD-10-CM

## 2013-03-12 DIAGNOSIS — S93429A Sprain of deltoid ligament of unspecified ankle, initial encounter: Secondary | ICD-10-CM | POA: Diagnosis not present

## 2013-03-12 DIAGNOSIS — M62838 Other muscle spasm: Secondary | ICD-10-CM | POA: Diagnosis present

## 2013-03-12 DIAGNOSIS — IMO0002 Reserved for concepts with insufficient information to code with codable children: Secondary | ICD-10-CM | POA: Diagnosis not present

## 2013-03-12 LAB — COMPREHENSIVE METABOLIC PANEL
AST: 15 U/L (ref 0–37)
Albumin: 3.4 g/dL — ABNORMAL LOW (ref 3.5–5.2)
Alkaline Phosphatase: 79 U/L (ref 39–117)
BUN: 17 mg/dL (ref 6–23)
CO2: 29 mEq/L (ref 19–32)
Chloride: 99 mEq/L (ref 96–112)
GFR calc non Af Amer: >90 mL/min (ref >90)
Glucose, Bld: 124 mg/dL — ABNORMAL HIGH (ref 70–99)
Potassium: 3.2 mEq/L — ABNORMAL LOW (ref 3.5–5.1)
Sodium: 139 mEq/L (ref 135–145)
Total Bilirubin: 0.6 mg/dL (ref 0.3–1.2)

## 2013-03-12 LAB — PROTIME-INR
INR: 0.99 (ref 0.00–1.49)
Prothrombin Time: 12.9 seconds (ref 11.6–15.2)

## 2013-03-12 LAB — MAGNESIUM: Magnesium: 1.7 mg/dL (ref 1.5–2.5)

## 2013-03-12 LAB — CBC
HCT: 36.3 % (ref 36.0–46.0)
Hemoglobin: 11.6 g/dL — ABNORMAL LOW (ref 12.0–15.0)
MCH: 28.7 pg (ref 26.0–34.0)
MCHC: 32 g/dL (ref 30.0–36.0)

## 2013-03-12 MED ORDER — HEPARIN SODIUM (PORCINE) 5000 UNIT/ML IJ SOLN
5000.0000 [IU] | Freq: Three times a day (TID) | INTRAMUSCULAR | Status: DC
  Administered 2013-03-12 – 2013-03-13 (×4): 5000 [IU] via SUBCUTANEOUS
  Filled 2013-03-12 (×8): qty 1

## 2013-03-12 MED ORDER — ONE-DAILY MULTI VITAMINS PO TABS: 1.0000 | ORAL_TABLET | Freq: Every day | ORAL | Status: DC

## 2013-03-12 MED ORDER — MESALAMINE 1.2 G PO TBEC
4800.0000 mg | DELAYED_RELEASE_TABLET | Freq: Every day | ORAL | Status: DC
  Administered 2013-03-13 – 2013-03-16 (×4): 4.8 g via ORAL
  Filled 2013-03-12 (×4): qty 4

## 2013-03-12 MED ORDER — BUDESONIDE 3 MG PO CP24
9.0000 mg | ORAL_CAPSULE | Freq: Every day | ORAL | Status: DC
  Administered 2013-03-13 – 2013-03-16 (×4): 9 mg via ORAL
  Filled 2013-03-12 (×4): qty 3

## 2013-03-12 MED ORDER — AMLODIPINE BESYLATE 10 MG PO TABS
10.0000 mg | ORAL_TABLET | Freq: Every day | ORAL | Status: DC
  Administered 2013-03-12 – 2013-03-16 (×4): 10 mg via ORAL
  Filled 2013-03-12 (×5): qty 1

## 2013-03-12 MED ORDER — CALCIUM CARBONATE-VITAMIN D 500-200 MG-UNIT PO TABS
2.0000 | ORAL_TABLET | Freq: Every day | ORAL | Status: DC
  Administered 2013-03-12 – 2013-03-16 (×4): 2 via ORAL
  Filled 2013-03-12 (×5): qty 2

## 2013-03-12 MED ORDER — CALCIUM CITRATE-VITAMIN D 315-200 MG-UNIT PO TABS: 2.0000 | ORAL_TABLET | Freq: Every day | ORAL | Status: DC

## 2013-03-12 MED ORDER — SODIUM CHLORIDE 0.9 % IV SOLN: 250.0000 mL | INTRAVENOUS | Status: DC | PRN

## 2013-03-12 MED ORDER — MORPHINE SULFATE 2 MG/ML IJ SOLN: 1.0000 mg | INTRAMUSCULAR | Status: DC | PRN

## 2013-03-12 MED ORDER — ALBUTEROL SULFATE HFA 108 (90 BASE) MCG/ACT IN AERS
2.0000 | INHALATION_SPRAY | Freq: Four times a day (QID) | RESPIRATORY_TRACT | Status: DC | PRN
  Filled 2013-03-12: qty 6.7

## 2013-03-12 MED ORDER — MONTELUKAST SODIUM 10 MG PO TABS
10.0000 mg | ORAL_TABLET | Freq: Every day | ORAL | Status: DC | PRN
  Filled 2013-03-12: qty 1

## 2013-03-12 MED ORDER — PANTOPRAZOLE SODIUM 20 MG PO TBEC
20.0000 mg | DELAYED_RELEASE_TABLET | Freq: Every day | ORAL | Status: DC
  Administered 2013-03-12 – 2013-03-16 (×5): 20 mg via ORAL
  Filled 2013-03-12 (×5): qty 1

## 2013-03-12 MED ORDER — ADULT MULTIVITAMIN W/MINERALS CH
1.0000 | ORAL_TABLET | Freq: Every day | ORAL | Status: DC
  Administered 2013-03-12 – 2013-03-16 (×4): 1 via ORAL
  Filled 2013-03-12 (×5): qty 1

## 2013-03-12 MED ORDER — LEVOTHYROXINE SODIUM 112 MCG PO TABS
112.0000 ug | ORAL_TABLET | Freq: Every day | ORAL | Status: DC
  Administered 2013-03-13 – 2013-03-16 (×4): 112 ug via ORAL
  Filled 2013-03-12 (×5): qty 1

## 2013-03-12 MED ORDER — SODIUM CHLORIDE 0.9 % IJ SOLN
3.0000 mL | INTRAMUSCULAR | Status: DC | PRN
  Administered 2013-03-13: 3 mL via INTRAVENOUS

## 2013-03-12 MED ORDER — SODIUM CHLORIDE 0.9 % IJ SOLN
3.0000 mL | Freq: Two times a day (BID) | INTRAMUSCULAR | Status: DC
  Administered 2013-03-12 – 2013-03-15 (×4): 3 mL via INTRAVENOUS

## 2013-03-12 MED ORDER — ONDANSETRON HCL 4 MG PO TABS
4.0000 mg | ORAL_TABLET | Freq: Four times a day (QID) | ORAL | Status: DC | PRN
  Filled 2013-03-12: qty 1

## 2013-03-12 MED ORDER — ONDANSETRON HCL 4 MG/2ML IJ SOLN: 4.0000 mg | Freq: Four times a day (QID) | INTRAMUSCULAR | Status: DC | PRN

## 2013-03-12 MED ORDER — BUDESONIDE 9 MG PO TB24: 9.0000 mg | ORAL_TABLET | Freq: Every day | ORAL | Status: DC

## 2013-03-12 MED ORDER — HYDROCHLOROTHIAZIDE 25 MG PO TABS
25.0000 mg | ORAL_TABLET | Freq: Every day | ORAL | Status: DC
  Administered 2013-03-12 – 2013-03-16 (×4): 25 mg via ORAL
  Filled 2013-03-12 (×5): qty 1

## 2013-03-12 MED ORDER — LORATADINE 10 MG PO TABS
10.0000 mg | ORAL_TABLET | Freq: Every day | ORAL | Status: DC
  Administered 2013-03-13 – 2013-03-16 (×4): 10 mg via ORAL
  Filled 2013-03-12 (×5): qty 1

## 2013-03-12 MED ORDER — POTASSIUM CHLORIDE ER 10 MEQ PO TBCR
20.0000 meq | EXTENDED_RELEASE_TABLET | Freq: Every day | ORAL | Status: DC
  Administered 2013-03-12 – 2013-03-16 (×5): 20 meq via ORAL
  Filled 2013-03-12 (×5): qty 2
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
## 2013-03-12 NOTE — Progress Notes (Signed)
Patient ID: [name], female   DOB: [birth year], 67 y.o.   MRN: [id number] paieint seen xrays reviewed by phone text picture from [facility].  mnay cases scheduled today and surgery would not be able to be done until late tonite. Will order diet and post surgery Monday afternoon. Will need to be NPO after MN Sunday except meds with sips.  Full consult later today.

## 2013-03-13 DIAGNOSIS — K5190 Ulcerative colitis, unspecified, without complications: Secondary | ICD-10-CM

## 2013-03-13 DIAGNOSIS — I10 Essential (primary) hypertension: Secondary | ICD-10-CM

## 2013-03-13 DIAGNOSIS — S82409A Unspecified fracture of shaft of unspecified fibula, initial encounter for closed fracture: Secondary | ICD-10-CM

## 2013-03-13 MED ORDER — CYCLOBENZAPRINE HCL 5 MG PO TABS
5.0000 mg | ORAL_TABLET | Freq: Once | ORAL | Status: AC
  Administered 2013-03-13: 5 mg via ORAL
  Filled 2013-03-13: qty 1

## 2013-03-13 MED ORDER — CEFAZOLIN SODIUM-DEXTROSE 2-3 GM-% IV SOLR
2.0000 g | Freq: Once | INTRAVENOUS | Status: AC
  Administered 2013-03-14: 2 g via INTRAVENOUS
  Filled 2013-03-13: qty 50

## 2013-03-13 NOTE — Progress Notes (Signed)
Subjective:   Procedure(s) (LRB): OPEN REDUCTION INTERNAL FIXATION (ORIF) ANKLE FRACTURE (Left) Patient reports pain as mild.    Objective: Vital signs in last 24 hours: Temp:  [97.4 F (36.3 C)-98.3 F (36.8 C)] 98.3 F (36.8 C) (10/19 0404) Pulse Rate:  [76-80] 76 (10/19 1053) Resp:  [18] 18 (10/19 0404) BP: (116-134)/(64-70) 119/70 mmHg (10/19 1053) SpO2:  [98 %-100 %] 98 % (10/19 0404)  Intake/Output from previous day:   Intake/Output this shift: Total I/O In: 243 [P.O.:240; I.V.:3] Out: -    Recent Labs  03/12/13 1726  HGB 11.6*    Recent Labs  03/12/13 1726  WBC 11.6*  RBC 4.04  HCT 36.3  PLT 403*    Recent Labs  03/12/13 1726  NA 139  K 3.2*  CL 99  CO2 29  BUN 17  CREATININE 0.66  GLUCOSE 124*  CALCIUM 9.5    Recent Labs  03/12/13 1726  INR 0.99    dressing dry  Assessment/Plan:   Procedure(s) (LRB): OPEN REDUCTION INTERNAL FIXATION (ORIF) ANKLE FRACTURE (Left) Plan surgery Monday at 3 PM  Plan and post op discussed  Kishon Garriga C 03/13/2013, 12:16 PM

## 2013-03-13 NOTE — Progress Notes (Signed)
Family Medicine Teaching Service Daily Progress Note Intern Pager: [phone]  Patient name: [name] Medical record number: [id number] Date of birth: [birth year] Age: 67 y.o. Gender: female  Primary Care Provider: Lucilla Edin, MD Consultants: Dr. Ophelia Charter - Ortho Code Status: Full Code  Pt Overview and Major Events to Date:  10/18 - Admitted with fibular fracture  Assessment and Plan:  # Fibular fracture - Diagnosed at urgent care prior to admission. Appreciate orthopedic surgery evaluating and caring for patient.  - Plan for procedure on Monday, 03/14/13. NPO Sunday after Midnight.  - Non-weight bearing  - Elevate leg  - PT/OT evaluations, most likely best post-operatively so will hold on these for now  - Pain control with Morphine for now, transition to PO medications when tolerated. (Pt states she will not take pain medication until approved by Dr. Dulce Sellar because of increased diarrhea lately.) She has not requested any prn medication  # HTN - Known history. BP stable - Con't home Amlodipine 10mg , HCTZ 25mg   - Vitals per floor protocol   # UC - Stable for now, but recent flare as an outpatient. Followed by Dr. Dulce Sellar. He increased her medication and added on a new medication within the last several weeks.  - Con't home medications including PPI  # Thyroid disease - Last TSH in June 2014 was 0.592  - Con't home Levothyroxine daily   # Allergic rhinitis - Possible asthma component as well  - Flonase daily  - Zyrtec not on formulary, offered Claritin - Albuterol prn wheezing  # Hypokalemia - Noted on admission, replete PO x1  FEN/GI: Normal diet today, NPO at midnight for procedure tomorrow PPx: Heparin SQ  Disposition: Surgery 03/14/13, anticipate d/c to SNF for short term rehab.  Subjective: Doing well this morning. Pain in leg with moving to use the bathroom, otherwise she feels fine.  Objective: Temp:  [97.4 F (36.3 C)-98.3 F (36.8 C)] 98.3 F (36.8  C) (10/19 0404) Pulse Rate:  [78-80] 80 (10/19 0404) Resp:  [18] 18 (10/19 0404) BP: (116-134)/(64-67) 116/64 mmHg (10/19 0404) SpO2:  [98 %-100 %] 98 % (10/19 0404)  Physical Exam: General: Reclined in bed, NAD. Pleasant and happy Cardiovascular: RRR, no murmur appreciated Respiratory: CTAB, good effort Abdomen: Soft, nontender Extremities: LLE in cast. Sensation intact in toes. 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 Right LE weakness.  Laboratory:  Recent Labs Lab 03/12/13 1726  WBC 11.6*  HGB 11.6*  HCT 36.3  PLT 403*    Recent Labs Lab 03/12/13 1726  NA 139  K 3.2*  CL 99  CO2 29  BUN 17  CREATININE 0.66  CALCIUM 9.5  PROT 8.1  BILITOT 0.6  ALKPHOS 79  ALT 15  AST 15  GLUCOSE 124*   INR 0.99  Imaging/Diagnostic Tests: X-ray performed at urgent care and reviewed by Dr. Ophelia Charter. Not repeated.  Hilarie Fredrickson, MD 03/13/2013, 8:27 AM PGY-3, [facility] FPTS Intern pager: [phone], text pages welcome

## 2013-03-13 NOTE — H&P (Signed)
Family Medicine Teaching Service Attending Note  I interviewed and examined patient [name] and reviewed their tests and x-rays.  I discussed with Dr. Mikel Cella and reviewed their note for today.  I agree with their assessment and plan.     Additionally  Medially stable Surgery as per Ortho

## 2013-03-14 ENCOUNTER — Encounter (HOSPITAL_COMMUNITY): Payer: Self-pay | Admitting: *Deleted

## 2013-03-14 ENCOUNTER — Encounter (HOSPITAL_COMMUNITY)
Admission: AD | Disposition: A | Discharge status: Skilled Nursing Facility | Payer: Self-pay | Source: Ambulatory Visit | Attending: Family Medicine

## 2013-03-14 ENCOUNTER — Encounter (HOSPITAL_COMMUNITY): Payer: Medicare Other | Admitting: Certified Registered Nurse Anesthetist

## 2013-03-14 ENCOUNTER — Inpatient Hospital Stay (HOSPITAL_COMMUNITY): Payer: Medicare Other

## 2013-03-14 ENCOUNTER — Inpatient Hospital Stay (HOSPITAL_COMMUNITY): Payer: Medicare Other | Admitting: Certified Registered Nurse Anesthetist

## 2013-03-14 DIAGNOSIS — E079 Disorder of thyroid, unspecified: Secondary | ICD-10-CM

## 2013-03-14 DIAGNOSIS — T148XXA Other injury of unspecified body region, initial encounter: Secondary | ICD-10-CM

## 2013-03-14 DIAGNOSIS — S8290XD Unspecified fracture of unspecified lower leg, subsequent encounter for closed fracture with routine healing: Secondary | ICD-10-CM

## 2013-03-14 HISTORY — PX: ORIF ANKLE FRACTURE: SHX5408

## 2013-03-14 LAB — SURGICAL PCR SCREEN: Staphylococcus aureus: NEGATIVE

## 2013-03-14 SURGERY — OPEN REDUCTION INTERNAL FIXATION (ORIF) ANKLE FRACTURE
Anesthesia: General | Site: Ankle | Laterality: Left | Wound class: Clean

## 2013-03-14 MED ORDER — LIDOCAINE HCL (CARDIAC) 20 MG/ML IV SOLN
INTRAVENOUS | Status: DC | PRN
  Administered 2013-03-14: 100 mg via INTRAVENOUS

## 2013-03-14 MED ORDER — HYDROCODONE-ACETAMINOPHEN 5-325 MG PO TABS
1.0000 | ORAL_TABLET | ORAL | Status: DC | PRN
  Administered 2013-03-16 (×2): 1 via ORAL
  Filled 2013-03-14 (×2): qty 1

## 2013-03-14 MED ORDER — METOCLOPRAMIDE HCL 10 MG PO TABS: 5.0000 mg | ORAL_TABLET | Freq: Three times a day (TID) | ORAL | Status: DC | PRN

## 2013-03-14 MED ORDER — ONDANSETRON HCL 4 MG/2ML IJ SOLN: 4.0000 mg | Freq: Four times a day (QID) | INTRAMUSCULAR | Status: DC | PRN

## 2013-03-14 MED ORDER — METOCLOPRAMIDE HCL 5 MG/ML IJ SOLN: 5.0000 mg | Freq: Three times a day (TID) | INTRAMUSCULAR | Status: DC | PRN

## 2013-03-14 MED ORDER — ASPIRIN 325 MG PO TABS
325.0000 mg | ORAL_TABLET | Freq: Every day | ORAL | Status: DC
  Administered 2013-03-14 – 2013-03-16 (×3): 325 mg via ORAL
  Filled 2013-03-14 (×3): qty 1

## 2013-03-14 MED ORDER — BUPIVACAINE-EPINEPHRINE PF 0.5-1:200000 % IJ SOLN
INTRAMUSCULAR | Status: DC | PRN
  Administered 2013-03-14: 30 mL

## 2013-03-14 MED ORDER — DOCUSATE SODIUM 100 MG PO CAPS: 100.0000 mg | ORAL_CAPSULE | Freq: Two times a day (BID) | ORAL | Status: DC

## 2013-03-14 MED ORDER — OXYCODONE HCL 5 MG PO TABS: 5.0000 mg | ORAL_TABLET | Freq: Once | ORAL | Status: DC | PRN

## 2013-03-14 MED ORDER — SODIUM CHLORIDE 0.45 % IV SOLN
INTRAVENOUS | Status: DC
  Administered 2013-03-14: 1 mL via INTRAVENOUS

## 2013-03-14 MED ORDER — PHENYLEPHRINE HCL 10 MG/ML IJ SOLN
INTRAMUSCULAR | Status: DC | PRN
  Administered 2013-03-14: 40 ug via INTRAVENOUS
  Administered 2013-03-14: 80 ug via INTRAVENOUS
  Administered 2013-03-14 (×2): 40 ug via INTRAVENOUS

## 2013-03-14 MED ORDER — MEPERIDINE HCL 25 MG/ML IJ SOLN: 6.2500 mg | INTRAMUSCULAR | Status: DC | PRN

## 2013-03-14 MED ORDER — LIDOCAINE-EPINEPHRINE (PF) 1.5 %-1:200000 IJ SOLN
INTRAMUSCULAR | Status: DC | PRN
  Administered 2013-03-14: 30 mL

## 2013-03-14 MED ORDER — ONDANSETRON HCL 4 MG/2ML IJ SOLN
INTRAMUSCULAR | Status: DC | PRN
  Administered 2013-03-14: 4 mg via INTRAVENOUS

## 2013-03-14 MED ORDER — LACTATED RINGERS IV SOLN: INTRAVENOUS | Status: DC

## 2013-03-14 MED ORDER — LACTATED RINGERS IV SOLN
INTRAVENOUS | Status: DC
  Administered 2013-03-14: 15:00:00 via INTRAVENOUS

## 2013-03-14 MED ORDER — FENTANYL CITRATE 0.05 MG/ML IJ SOLN
100.0000 ug | Freq: Once | INTRAMUSCULAR | Status: AC
  Administered 2013-03-14: 100 ug via INTRAVENOUS

## 2013-03-14 MED ORDER — HYDROMORPHONE HCL PF 1 MG/ML IJ SOLN: 0.2500 mg | INTRAMUSCULAR | Status: DC | PRN

## 2013-03-14 MED ORDER — INFLUENZA VAC SPLIT QUAD 0.5 ML IM SUSP
0.5000 mL | INTRAMUSCULAR | Status: AC
  Administered 2013-03-15: 0.5 mL via INTRAMUSCULAR
  Filled 2013-03-14: qty 0.5

## 2013-03-14 MED ORDER — DEXAMETHASONE SODIUM PHOSPHATE 4 MG/ML IJ SOLN
INTRAMUSCULAR | Status: DC | PRN
  Administered 2013-03-14: 4 mg via INTRAVENOUS

## 2013-03-14 MED ORDER — ONDANSETRON HCL 4 MG/2ML IJ SOLN: 4.0000 mg | Freq: Once | INTRAMUSCULAR | Status: DC | PRN

## 2013-03-14 MED ORDER — FENTANYL CITRATE 0.05 MG/ML IJ SOLN
INTRAMUSCULAR | Status: DC | PRN
  Administered 2013-03-14 (×2): 50 ug via INTRAVENOUS

## 2013-03-14 MED ORDER — PROPOFOL 10 MG/ML IV BOLUS
INTRAVENOUS | Status: DC | PRN
  Administered 2013-03-14: 200 mg via INTRAVENOUS

## 2013-03-14 MED ORDER — ONDANSETRON HCL 4 MG PO TABS: 4.0000 mg | ORAL_TABLET | Freq: Four times a day (QID) | ORAL | Status: DC | PRN

## 2013-03-14 MED ORDER — OXYCODONE HCL 5 MG/5ML PO SOLN: 5.0000 mg | Freq: Once | ORAL | Status: DC | PRN

## 2013-03-14 MED ORDER — MORPHINE SULFATE 2 MG/ML IJ SOLN: 1.0000 mg | INTRAMUSCULAR | Status: DC | PRN

## 2013-03-14 MED ORDER — LACTATED RINGERS IV SOLN
INTRAVENOUS | Status: DC | PRN
  Administered 2013-03-14: 16:00:00 via INTRAVENOUS

## 2013-03-14 MED ORDER — MIDAZOLAM HCL 2 MG/2ML IJ SOLN
2.0000 mg | Freq: Once | INTRAMUSCULAR | Status: AC
  Administered 2013-03-14: 2 mg via INTRAVENOUS

## 2013-03-14 SURGICAL SUPPLY — 57 items
BANDAGE ELASTIC 4 VELCRO ST LF (GAUZE/BANDAGES/DRESSINGS) IMPLANT
BANDAGE ELASTIC 6 VELCRO ST LF (GAUZE/BANDAGES/DRESSINGS) ×1 IMPLANT
BANDAGE ESMARK 6X9 LF (GAUZE/BANDAGES/DRESSINGS) IMPLANT
BIT DRILL 2.5X110 QC LCP DISP (BIT) ×1 IMPLANT
BNDG CMPR 9X6 STRL LF SNTH (GAUZE/BANDAGES/DRESSINGS)
BNDG ESMARK 6X9 LF (GAUZE/BANDAGES/DRESSINGS)
CLOTH BEACON ORANGE TIMEOUT ST (SAFETY) ×2 IMPLANT
COVER MAYO STAND STRL (DRAPES) ×2 IMPLANT
COVER SURGICAL LIGHT HANDLE (MISCELLANEOUS) ×2 IMPLANT
CUFF TOURNIQUET SINGLE 34IN LL (TOURNIQUET CUFF) ×1 IMPLANT
CUFF TOURNIQUET SINGLE 44IN (TOURNIQUET CUFF) IMPLANT
DRAPE C-ARM 42X72 X-RAY (DRAPES) ×1 IMPLANT
DRAPE INCISE IOBAN 66X45 STRL (DRAPES) ×2 IMPLANT
DRAPE PROXIMA HALF (DRAPES) ×2 IMPLANT
DRAPE U-SHAPE 47X51 STRL (DRAPES) ×2 IMPLANT
DRSG PAD ABDOMINAL 8X10 ST (GAUZE/BANDAGES/DRESSINGS) ×2 IMPLANT
DURAPREP 26ML APPLICATOR (WOUND CARE) ×2 IMPLANT
ELECT REM PT RETURN 9FT ADLT (ELECTROSURGICAL) ×2
ELECTRODE REM PT RTRN 9FT ADLT (ELECTROSURGICAL) ×1 IMPLANT
GAUZE XEROFORM 5X9 LF (GAUZE/BANDAGES/DRESSINGS) ×2 IMPLANT
GLOVE BIOGEL PI IND STRL 7.5 (GLOVE) ×1 IMPLANT
GLOVE BIOGEL PI IND STRL 8 (GLOVE) ×1 IMPLANT
GLOVE BIOGEL PI INDICATOR 7.5 (GLOVE) ×1
GLOVE BIOGEL PI INDICATOR 8 (GLOVE) ×1
GLOVE ECLIPSE 7.0 STRL STRAW (GLOVE) ×2 IMPLANT
GLOVE ORTHO TXT STRL SZ7.5 (GLOVE) ×2 IMPLANT
GOWN PREVENTION PLUS LG XLONG (DISPOSABLE) ×1 IMPLANT
GOWN PREVENTION PLUS XLARGE (GOWN DISPOSABLE) ×2 IMPLANT
GOWN STRL NON-REIN LRG LVL3 (GOWN DISPOSABLE) ×4 IMPLANT
KIT 1/3 TUB PL 7H 85M (Orthopedic Implant) IMPLANT
KIT BASIN OR (CUSTOM PROCEDURE TRAY) ×2 IMPLANT
KIT ROOM TURNOVER OR (KITS) ×2 IMPLANT
MANIFOLD NEPTUNE II (INSTRUMENTS) ×2 IMPLANT
NS IRRIG 1000ML POUR BTL (IV SOLUTION) ×2 IMPLANT
PACK ORTHO EXTREMITY (CUSTOM PROCEDURE TRAY) ×2 IMPLANT
PAD ARMBOARD 7.5X6 YLW CONV (MISCELLANEOUS) ×4 IMPLANT
PAD CAST 4YDX4 CTTN HI CHSV (CAST SUPPLIES) ×1 IMPLANT
PADDING CAST COTTON 4X4 STRL (CAST SUPPLIES) ×2
PADDING CAST COTTON 6X4 STRL (CAST SUPPLIES) ×2 IMPLANT
PROS 1/3 TUB PL 7H 85M (Orthopedic Implant) ×2 IMPLANT
SCREW CANC FT ST SFS 4X14 (Screw) ×1 IMPLANT
SCREW CANC PT 4.0X40 (Screw) ×2 IMPLANT
SCREW CANC PT 40X14X4X6X (Screw) IMPLANT
SCREW CORTEX 3.5 14MM (Screw) ×4 IMPLANT
SCREW LOCK CORT ST 3.5X14 (Screw) IMPLANT
SPONGE GAUZE 4X4 12PLY (GAUZE/BANDAGES/DRESSINGS) ×2 IMPLANT
SPONGE LAP 18X18 X RAY DECT (DISPOSABLE) ×2 IMPLANT
STAPLER VISISTAT 35W (STAPLE) ×1 IMPLANT
SUCTION FRAZIER TIP 10 FR DISP (SUCTIONS) ×2 IMPLANT
SUT ETHILON 3 0 PS 1 (SUTURE) ×4 IMPLANT
SUT VIC AB 2-0 CT1 27 (SUTURE) ×4
SUT VIC AB 2-0 CT1 TAPERPNT 27 (SUTURE) ×2 IMPLANT
TOWEL OR 17X24 6PK STRL BLUE (TOWEL DISPOSABLE) ×2 IMPLANT
TOWEL OR 17X26 10 PK STRL BLUE (TOWEL DISPOSABLE) ×2 IMPLANT
TUBE CONNECTING 12X1/4 (SUCTIONS) ×2 IMPLANT
WATER STERILE IRR 1000ML POUR (IV SOLUTION) ×2 IMPLANT
YANKAUER SUCT BULB TIP NO VENT (SUCTIONS) ×2 IMPLANT

## 2013-03-14 NOTE — Care Management Utilization Note (Signed)
Utilization review completed. Vance Peper, RN BSN Nurse Case Manager

## 2013-03-14 NOTE — Preoperative (Signed)
Beta Blockers   Reason not to administer Beta Blockers:Not Applicable 

## 2013-03-14 NOTE — Brief Op Note (Signed)
03/12/2013 - 03/14/2013  5:00 PM  PATIENT:  [name]  68 y.o. female  PRE-OPERATIVE DIAGNOSIS:  left distal ankle fracture , syndosmotic rupture, fibula fracture  POST-OPERATIVE DIAGNOSIS:  left distal ankle fracture   PROCEDURE:  Procedure(s): OPEN REDUCTION INTERNAL FIXATION (ORIF) ANKLE FRACTURE (Left) ,  ORIF syndosmosis ( distal tib fib joint)  SURGEON:  Surgeon(s) and Role:    * Eldred Manges, MD - Primary  PHYSICIAN ASSISTANT:   ASSISTANTS: none   ANESTHESIA:   regional and general  EBL:  Total I/O In: 700 [I.V.:700] Out: -   BLOOD ADMINISTERED:none  DRAINS: none   LOCAL MEDICATIONS USED:  NONE  SPECIMEN:  No Specimen  DISPOSITION OF SPECIMEN:  N/A  COUNTS:  YES  TOURNIQUET:   Total Tourniquet Time Documented: Thigh (Left) - 19 minutes Total: Thigh (Left) - 19 minutes   DICTATION: .Reubin Milan Dictation  PLAN OF CARE: already inpatient  PATIENT DISPOSITION:  PACU - hemodynamically stable.   Delay start of Pharmacological VTE agent (>24hrs) due to surgical blood loss or risk of bleeding:  ASA and SCD's

## 2013-03-14 NOTE — Transfer of Care (Signed)
Immediate Anesthesia Transfer of Care Note  Patient: [name]  Procedure(s) Performed: Procedure(s): OPEN REDUCTION INTERNAL FIXATION (ORIF) ANKLE FRACTURE (Left)  Patient Location: PACU  Anesthesia Type:General  Level of Consciousness: awake and alert   Airway & Oxygen Therapy: Patient Spontanous Breathing and Patient connected to nasal cannula oxygen  Post-op Assessment: Report given to PACU RN, Post -op Vital signs reviewed and stable and Patient moving all extremities  Post vital signs: Reviewed and stable  Complications: No apparent anesthesia complications

## 2013-03-14 NOTE — Anesthesia Procedure Notes (Signed)
Anesthesia Regional Block:  Popliteal block  Pre-Anesthetic Checklist: ,, timeout performed, Correct Patient, Correct Site, Correct Laterality, Correct Procedure, Correct Position, site marked, Risks and benefits discussed,  Surgical consent,  Pre-op evaluation,  At surgeon's request and post-op pain management  Laterality: Left  Prep: chloraprep       Needles:  Injection technique: Single-shot  Needle Type: Echogenic Stimulator Needle     Needle Length: 10cm 10 cm Needle Gauge: 21 and 21 G    Additional Needles:  Procedures: ultrasound guided (picture in chart) and nerve stimulator Popliteal block  Nerve Stimulator or Paresthesia:  Response: 0.4 mA,   Additional Responses:   Narrative:  Start time: 03/14/2013 3:10 PM End time: 03/14/2013 3:23 PM Injection made incrementally with aspirations every 5 mL.  Performed by: Personally  Anesthesiologist: Arta Bruce MD  Additional Notes: Monitors applied. Patient sedated. Sterile prep and drape,hand hygiene and sterile gloves were used. Relevant anatomy identified.Needle position confirmed.Local anesthetic injected incrementally after negative aspiration. Local anesthetic spread visualized around nerve(s). Vascular puncture avoided. No complications. Image printed for medical record.The patient tolerated the procedure well.  Additional Saphenous nerve block performed. 15cc Local Anesthetic mixture placed under ultrasonic guidance in the Adductor Canal alongside the Femoral Artery..  No Problems encountered.  Arta Bruce MD   Popliteal block

## 2013-03-14 NOTE — Progress Notes (Signed)
Family Medicine Teaching Service Daily Progress Note Intern Pager: [phone]  Patient name: [name] Medical record number: [id number] Date of birth: August 18, 1945 Age: 67 y.o. Gender: female  Primary Care Provider: Lucilla Edin, MD Consultants: Dr. Ophelia Charter - Ortho Code Status: Full Code  Pt Overview and Major Events to Date:  10/18 - Admitted with fibular fracture 10/20 - Surgery today at 3:00 pm   Assessment and Plan: [name] is a 67 y.o. female presenting from orthopedic urgent care with left fibular fracture. PMH is significant for ulcerative colitis, HTN, allergies and thyroid disease.  # Fibular fracture - Diagnosed at urgent care prior to admission. Appreciate orthopedic surgery evaluating and caring for patient.  - Open reduction internal fixation (ORIF) ankle fracture (Left): today at 3:00  - Non-weight bearing  - Elevate leg  - Pain control with Morphine for now, transition to PO medications when tolerated. (Pt states she will not take pain medication until approved by Dr. Dulce Sellar because of increased diarrhea lately.) She has not requested any prn medication  # HTN - Known history. BP stable - Con't home Amlodipine 10mg , HCTZ 25mg    # UC - Stable for now, but recent flare as an outpatient. Followed by Dr. Dulce Sellar. He increased her medication and added on a new medication within the last several weeks.  - Con't home medications including PPI - doesn't want to take pain medications b/c she doesn't want to cause a flare.   # Thyroid disease - Last TSH in June 2014 was 0.592  - Con't home Levothyroxine daily   # Allergic rhinitis - Possible asthma component as well  - Flonase daily  - Zyrtec not on formulary, offered Claritin - Albuterol prn wheezing  # Hypokalemia - Noted on admission, replete PO x1 - BMET tomorrow AM   FEN/GI: Normal diet today, NPO at midnight for procedure tomorrow PPx: Heparin SQ  Disposition: Surgery 03/14/13, anticipate  d/c to SNF for short term rehab.  Subjective: Had muscle spasms last night and was given a one time dose of flexeril.  She had no complaints this morning and ready for surgery.   Objective: Temp:  [98.1 F (36.7 C)-98.7 F (37.1 C)] 98.4 F (36.9 C) (10/20 0541) Pulse Rate:  [74-86] 74 (10/20 0541) Resp:  [18] 18 (10/20 0541) BP: (111-126)/(65-75) 115/65 mmHg (10/20 0541) SpO2:  [96 %-100 %] 96 % (10/20 0541) Weight:  [152 lb 1.9 oz (69 kg)] 152 lb 1.9 oz (69 kg) (10/20 0901)  Physical Exam: General: Reclined in bed, NAD. Pleasant and happy Cardiovascular: RRR, no murmur appreciated Respiratory: CTAB, good effort Abdomen: Soft, nontender Extremities: LLE in cast. Sensation intact in toes. 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 Right LE weakness.  Laboratory:  Recent Labs Lab 03/12/13 1726  WBC 11.6*  HGB 11.6*  HCT 36.3  PLT 403*    Recent Labs Lab 03/12/13 1726  NA 139  K 3.2*  CL 99  CO2 29  BUN 17  CREATININE 0.66  CALCIUM 9.5  PROT 8.1  BILITOT 0.6  ALKPHOS 79  ALT 15  AST 15  GLUCOSE 124*   INR 0.99  Imaging/Diagnostic Tests: X-ray performed at urgent care and reviewed by Dr. Ophelia Charter. Not repeated.  Clare Gandy, MD 03/14/2013, 9:34 AM PGY-3, [facility] FPTS Intern pager: [phone], text pages welcome

## 2013-03-14 NOTE — Interval H&P Note (Signed)
History and Physical Interval Note:  03/14/2013 8:03 AM  [name]  has presented today for surgery, with the diagnosis of left distal   The various methods of treatment have been discussed with the patient and family. After consideration of risks, benefits and other options for treatment, the patient has consented to  Procedure(s): OPEN REDUCTION INTERNAL FIXATION (ORIF) ANKLE FRACTURE (Left) as a surgical intervention .  The patient's history has been reviewed, patient examined, no change in status, stable for surgery.  I have reviewed the patient's chart and labs.  Questions were answered to the patient's satisfaction.     Cherell Colvin C

## 2013-03-14 NOTE — Progress Notes (Signed)
FMTS Attending Daily Note:  Renold Don MD  [phone] pager  Family Practice pager:  [phone] I have seen and examined this patient and have reviewed their chart. I have discussed this patient with the resident. I agree with the resident's findings, assessment and care plan.  Additionally: - patient cleared and ready for surgery - No acute medical issues at this time  Tobey Grim, MD 03/14/2013 9:40 AM

## 2013-03-14 NOTE — H&P (View-Only) (Signed)
Subjective:   Procedure(s) (LRB): OPEN REDUCTION INTERNAL FIXATION (ORIF) ANKLE FRACTURE (Left) Patient reports pain as mild.    Objective: Vital signs in last 24 hours: Temp:  [97.4 F (36.3 C)-98.3 F (36.8 C)] 98.3 F (36.8 C) (10/19 0404) Pulse Rate:  [76-80] 76 (10/19 1053) Resp:  [18] 18 (10/19 0404) BP: (116-134)/(64-70) 119/70 mmHg (10/19 1053) SpO2:  [98 %-100 %] 98 % (10/19 0404)  Intake/Output from previous day:   Intake/Output this shift: Total I/O In: 243 [P.O.:240; I.V.:3] Out: -    Recent Labs  03/12/13 1726  HGB 11.6*    Recent Labs  03/12/13 1726  WBC 11.6*  RBC 4.04  HCT 36.3  PLT 403*    Recent Labs  03/12/13 1726  NA 139  K 3.2*  CL 99  CO2 29  BUN 17  CREATININE 0.66  GLUCOSE 124*  CALCIUM 9.5    Recent Labs  03/12/13 1726  INR 0.99    dressing dry  Assessment/Plan:   Procedure(s) (LRB): OPEN REDUCTION INTERNAL FIXATION (ORIF) ANKLE FRACTURE (Left) Plan surgery Monday at 3 PM  Plan and post op discussed  [name] C 03/13/2013, 12:16 PM  

## 2013-03-14 NOTE — Anesthesia Preprocedure Evaluation (Addendum)
Anesthesia Evaluation  Patient identified by MRN, date of birth, ID band Patient awake    Reviewed: Allergy & Precautions, H&P , NPO status , Patient's Chart, lab work & pertinent test results  History of Anesthesia Complications (+) PONVHistory of anesthetic complications: with hysterectomy.  Airway Mallampati: II TM Distance: >3 FB Neck ROM: Full    Dental  (+) Teeth Intact and Dental Advisory Given   Pulmonary neg pulmonary ROS,  breath sounds clear to auscultation        Cardiovascular hypertension, Pt. on medications Rhythm:Regular Rate:Normal     Neuro/Psych negative neurological ROS  negative psych ROS   GI/Hepatic Neg liver ROS, PUD,   Endo/Other  negative endocrine ROS  Renal/GU negative Renal ROS     Musculoskeletal negative musculoskeletal ROS (+)   Abdominal Normal abdominal exam  (+)   Peds  Hematology negative hematology ROS (+)   Anesthesia Other Findings   Reproductive/Obstetrics negative OB ROS                          Anesthesia Physical Anesthesia Plan  ASA: II  Anesthesia Plan: General   Post-op Pain Management:    Induction: Intravenous  Airway Management Planned: LMA  Additional Equipment:   Intra-op Plan:   Post-operative Plan: Extubation in OR  Informed Consent: I have reviewed the patients History and Physical, chart, labs and discussed the procedure including the risks, benefits and alternatives for the proposed anesthesia with the patient or authorized representative who has indicated his/her understanding and acceptance.   Dental advisory given  Plan Discussed with: CRNA and Surgeon  Anesthesia Plan Comments:        Anesthesia Quick Evaluation

## 2013-03-14 NOTE — Op Note (Signed)
Test [id number]  Preop diagnosis: Left Ankle  Weber C  lateral malleolar fracture with syndesmosis disruption and deltoid ligament tear  Postop diagnosis: Same  Procedure: ORIF left lateral malleolus  fibula. ORIF syndesmosis. l  Surgeon: Annell Greening M.D.  Anesthesia preoperative regional block plus general anesthesia  Tourniquet time: 350x19 minutes.  Procedure: After induction of general anesthesia with preoperative regional block the left leg had a proximal thigh tourniquet applied over stockinette DuraPrep up to the knee including the toes was performed in the usual impervious stockinette and extremity sheets and drapes were applied. Ancef was given prophylactically and timeout procedure was completed.  Lateral incision was made directly over the fibula subperiosteal dissection reduction held with a self-retaining clamp and a Synthes one third tubular plate was selected. Proximal 2 screws were placed in the second off and the bottom line epithelial growth plate for the syndesmotic screw placement. Screws were 14 mm proximally and a 14 mm cancellus unicortical was placed distally. The totally was avulsed was obviously loose noted under fluoroscopy. Once all screws were filled except for the second from the bottom: The screw the syndesmosis was closed using a hand pressure with fingertips to squeeze and reduce it holding the ankle in dorsiflexion and then drilling and placing the syndesmotic screw. 40 mm screw was selected tightened down securely compressing the syndesmosis and then checking under fluoroscopy AP lateral. There was good reduction of the ankle. Tourniquet was deflated copious irrigation in standard closure with 2-0 Vicryl in the subcutaneous tissue and skin staple closure Xeroform 4 x 4's ABDs and web roll shortly split more Webb roll and Ace wrap. Procedure was as planned.                    Annell Greening M.D.

## 2013-03-15 ENCOUNTER — Encounter (HOSPITAL_COMMUNITY): Payer: Self-pay | Admitting: Orthopaedic Surgery

## 2013-03-15 LAB — BASIC METABOLIC PANEL
BUN: 17 mg/dL (ref 6–23)
CO2: 28 mEq/L (ref 19–32)
Calcium: 9.2 mg/dL (ref 8.4–10.5)
Chloride: 103 mEq/L (ref 96–112)
Creatinine, Ser: 0.54 mg/dL (ref 0.50–1.10)
GFR calc Af Amer: >90 mL/min (ref >90)
Sodium: 138 mEq/L (ref 135–145)

## 2013-03-15 LAB — CBC
HCT: 33.7 % — ABNORMAL LOW (ref 36.0–46.0)
Hemoglobin: 10.9 g/dL — ABNORMAL LOW (ref 12.0–15.0)
MCH: 29.5 pg (ref 26.0–34.0)
MCHC: 32.3 g/dL (ref 30.0–36.0)
MCV: 91.3 fL (ref 78.0–100.0)
RDW: 13.7 % (ref 11.5–15.5)
WBC: 8 10*3/uL (ref 4.0–10.5)

## 2013-03-15 MED ORDER — OXYCODONE HCL 5 MG PO TABS: 5.0000 mg | ORAL_TABLET | ORAL | Status: DC | PRN

## 2013-03-15 MED ORDER — ASPIRIN EC 325 MG PO TBEC: 325.0000 mg | DELAYED_RELEASE_TABLET | Freq: Every day | ORAL | Status: DC

## 2013-03-15 NOTE — Discharge Summary (Signed)
Family Medicine Teaching [facility] Discharge Summary  Patient name: [name] Medical record number: [id number] Date of birth: 1946/03/26 Age: 67 y.o. Gender: female Date of Admission: 03/12/2013  Date of Discharge: 03/16/13 Admitting Physician: Carney Living, MD  Primary Care Provider: Lucilla Edin, MD Consultants: Ortopedics  Indication for Hospitalization: broken left fibula   Discharge Diagnoses/Problem List:  Closed fibular fracture  Allergic rhinitis  Ulcerative colitis  HTN   Disposition: SNF   Discharge Condition: stable     Brief Hospital Course:  [name] is a 67 y.o. female presenting from orthopedic urgent care with left fibular fracture. PMH is significant for ulcerative colitis, HTN, allergies and thyroid disease.   # Fibular fracture - Diagnosed at urgent care prior to admission. She was not asking for any PRN pain medication because she thought it would exacerbate an ulcerative colitis flare. She was not complaining of being in pain prior to surgery. Orthopedics took her for an open reduction internal fixation ankle fracture on 10/21. She tolerated the procedure well.  Her pain was much relieved post surgery. Orthopedics recommended ASA 325 daily for VTE prophylaxis. She should elevate her leg at rest. Non weight bearing for ambulation with walker. Keep splint dry and clean at all times. I spoke with Dr. Dulce Sellar and tramadol can be used for her pain control and avoidence of NSAIDS. Flexeril would be appropriate if she has any muscle spasms.    # HTN - Known history. BP stable on admission and continued on home medications.   # UC - Stable for now, but recent flare as an outpatient. Followed by Dr. Dulce Sellar. He increased her medication and added on a new medication within the last several weeks. She has been refusing certain medications (pain medications and apirin) because she believes these will exacerbate a flare. She has been stable  through admission and discharge.    # Thyroid disease - Last TSH in June 2014 was 0.692. Continued on home medications and stable on discharge.   # Allergic rhinitis - Reported history of allergies. Taking zyrtec at home. Received claritin and flonase during admission. Stable upon discharge.   # Hypokalemia - Noted on admission and was repleted with oral potassium. She had no changes on EKG and normalized prior to discharge.   Issues for Follow Up:  1. Fibular fracture: follow up with orthopedics. Pain can be controlled with Tramadol and flexeril for muscle spasms.   2. UC: ASA 325 mg is recommended for VTE prophylaxis and Dr. Dulce Sellar is ok with this regimen. It should not initiate an UC flare.    Significant Procedures: open reduction and internal fixation of left fibula fracture. 10/20  Significant Labs and Imaging:   Recent Labs Lab 03/12/13 1726 03/15/13 0410  WBC 11.6* 8.0  HGB 11.6* 10.9*  HCT 36.3 33.7*  PLT 403* 387    Recent Labs Lab 03/12/13 1726 03/15/13 0410  NA 139 138  K 3.2* 4.5  CL 99 103  CO2 29 28  GLUCOSE 124* 121*  BUN 17 17  CREATININE 0.66 0.54  CALCIUM 9.5 9.2  MG 1.7  --   PHOS 2.9  --   ALKPHOS 79  --   AST 15  --   ALT 15  --   ALBUMIN 3.4*  --     Results/Tests Pending at Time of Discharge: none   Discharge Medications:    Medication List         albuterol 108 (90 BASE)  MCG/ACT inhaler  Commonly known as:  PROVENTIL HFA;VENTOLIN HFA  Inhale 2 puffs into the lungs every 6 (six) hours as needed for wheezing.     amLODipine 10 MG tablet  Commonly known as:  NORVASC  Take 10 mg by mouth daily.     aspirin EC 325 MG tablet  Take 1 tablet (325 mg total) by mouth daily.     CALCIUM + D PO  Take 2 tablets by mouth daily.     cetirizine 10 MG tablet  Commonly known as:  ZYRTEC  Take 10 mg by mouth daily as needed for allergies.     cholecalciferol 1000 UNITS tablet  Commonly known as:  VITAMIN D  Take 1,000 Units by mouth  daily.     cyclobenzaprine 5 MG tablet  Commonly known as:  FLEXERIL  Take 1 tablet (5 mg total) by mouth daily as needed for muscle spasms.     hydrochlorothiazide 25 MG tablet  Commonly known as:  HYDRODIURIL  Take 25 mg by mouth daily.     levothyroxine 112 MCG tablet  Commonly known as:  SYNTHROID  Take 1 tablet (112 mcg total) by mouth daily.     LIALDA 1.2 G EC tablet  Generic drug:  mesalamine  Take 4,800 mg by mouth daily.     montelukast 10 MG tablet  Commonly known as:  SINGULAIR  Take 10 mg by mouth daily as needed (allergies).     multivitamin tablet  Take 1 tablet by mouth daily.     pantoprazole 20 MG tablet  Commonly known as:  PROTONIX  Take 20 mg by mouth daily.     potassium chloride 10 MEQ tablet  Commonly known as:  K-DUR  Take 20 mEq by mouth daily.     traMADol 50 MG tablet  Commonly known as:  ULTRAM  Take 1 tablet (50 mg total) by mouth every 6 (six) hours as needed for pain.     UCERIS 9 MG Tb24  Generic drug:  Budesonide  Take 9 mg by mouth daily.        Discharge Instructions: Please refer to Patient Instructions section of EMR for full details.  Patient was counseled important signs and symptoms that should prompt return to medical care, changes in medications, dietary instructions, activity restrictions, and follow up appointments.   Follow-Up Appointments: Follow-up Information   Follow up with YATES,MARK C, MD In 2 weeks.   Specialty:  Orthopedic Surgery   Contact information:   [address] [place] [address] [phone]       Clare Gandy, MD 03/16/2013, 12:16 PM PGY-1, [facility]
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
## 2013-03-15 NOTE — Progress Notes (Signed)
Subjective: 1 Day Post-Op Procedure(s) (LRB): OPEN REDUCTION INTERNAL FIXATION (ORIF) ANKLE FRACTURE (Left) Patient reports pain as mild.  Has not used any pain meds today.  Walked with PT earlier and made it to the door.  Very tired after short distance.  Low endurance.   Plans to go to SNF at discharge until daughter comes to live with her in app 3 weeks.   Wants a chance for her family to look at chosen facility before she is transferred..  No bed offers when pt seen earlier on rounds.  Objective: Vital signs in last 24 hours: Temp:  [97.3 F (36.3 C)-98.4 F (36.9 C)] 98.1 F (36.7 C) (10/21 1339) Pulse Rate:  [69-85] 80 (10/21 1339) Resp:  [13-18] 17 (10/21 1339) BP: (92-126)/(53-70) 118/70 mmHg (10/21 1339) SpO2:  [93 %-100 %] 100 % (10/21 1339)  Intake/Output from previous day: 10/20 0701 - 10/21 0700 In: 1780 [P.O.:480; I.V.:1300] Out: -  Intake/Output this shift: Total I/O In: 700 [P.O.:700] Out: -    Recent Labs  03/12/13 1726 03/15/13 0410  HGB 11.6* 10.9*    Recent Labs  03/12/13 1726 03/15/13 0410  WBC 11.6* 8.0  RBC 4.04 3.69*  HCT 36.3 33.7*  PLT 403* 387    Recent Labs  03/12/13 1726 03/15/13 0410  NA 139 138  K 3.2* 4.5  CL 99 103  CO2 29 28  BUN 17 17  CREATININE 0.66 0.54  GLUCOSE 124* 121*  CALCIUM 9.5 9.2    Recent Labs  03/12/13 1726  INR 0.99    Neurovascular intact Sensation intact distally  Assessment/Plan: 1 Day Post-Op Procedure(s) (LRB): OPEN REDUCTION INTERNAL FIXATION (ORIF) ANKLE FRACTURE (Left) Up with therapy Discharge to SNF probably tomorrow if has acceptable bed offer and continues to progress with PT  Jayse Hodkinson M 03/15/2013, 4:21 PM

## 2013-03-15 NOTE — Progress Notes (Signed)
[facility] has a bed at [facility].  Sabino Niemann, MSW, [employer] [phone]

## 2013-03-15 NOTE — Clinical Social Work Placement (Signed)
Clinical Social Work Department  CLINICAL SOCIAL WORK PLACEMENT NOTE    Patient: MARLYCE MCDOUGALD Account Number:  [id number] Admit date: 03/15/13 Clinical Social Worker: Sabino Niemann LCSWA Date/time: 03/15/2013 11:30 AM  Clinical Social Work is seeking post-discharge placement for this patient at the following level of care: SKILLED NURSING (*CSW will update this form in Epic as items are completed)  03/15/2013 Patient/family provided with [facility] Department of Clinical Social Work's list of facilities offering this level of care within the geographic area requested by the patient (or if unable, by the patient's family).  03/15/2013 Patient/family informed of their freedom to choose among providers that offer the needed level of care, that participate in Medicare, Medicaid or managed care program needed by the patient, have an available bed and are willing to accept the patient.  03/15/2013 Patient/family informed of [facility]' ownership interest in [facility], as well as of the fact that they are under no obligation to receive care at this facility.  PASARR submitted to EDS on 03/15/2013 PASARR number received from EDS on 03/15/2013  FL2 transmitted to all facilities in geographic area requested by pt/family on  FL2 transmitted to all facilities within larger geographic area on  Patient informed that his/her managed care company has contracts with or will negotiate with certain facilities, including the following:  Patient/family informed of bed offers received: 03/15/2013 Patient chooses bed at [facility] Physician recommends and patient chooses bed at  Patient to be transferred to on  Patient to be transferred to facility by  The following physician request were entered in Epic:  Additional Comments:

## 2013-03-15 NOTE — Anesthesia Postprocedure Evaluation (Signed)
Anesthesia Post Note  Patient: [name]  Procedure(s) Performed: Procedure(s) (LRB): OPEN REDUCTION INTERNAL FIXATION (ORIF) ANKLE FRACTURE (Left)  Anesthesia type: general  Patient location: PACU  Post pain: Pain level controlled  Post assessment: Patient's Cardiovascular Status Stable  Last Vitals:  Filed Vitals:   03/15/13 0536  BP: 103/63  Pulse: 74  Temp: 36.9 C  Resp: 18    Post vital signs: Reviewed and stable  Level of consciousness: sedated  Complications: No apparent anesthesia complications

## 2013-03-15 NOTE — Progress Notes (Signed)
FMTS Attending Daily Note:  Renold Don MD  [phone] pager  Family Practice pager:  [phone] I have seen and examined this patient and have reviewed their chart. I have discussed this patient with the resident. I agree with the resident's findings, assessment and care plan.  Tobey Grim, MD 03/15/2013
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
## 2013-03-15 NOTE — Progress Notes (Signed)
Family Medicine Teaching Service Daily Progress Note Intern Pager: [phone]  Patient name: [name] Medical record number: [id number] Date of birth: [birth year] Age: 67 y.o. Gender: female  Primary Care Provider: Lucilla Edin, MD Consultants: Dr. Ophelia Charter - Ortho Code Status: Full Code  Pt Overview and Major Events to Date:  10/18 - Admitted with fibular fracture 10/20 - Surgery today at 3:00 pm   Assessment and Plan: ROGENIA WERNTZ is a 67 y.o. female presenting from orthopedic urgent care with left fibular fracture. PMH is significant for ulcerative colitis, HTN, allergies and thyroid disease.  # Fibular fracture -  Pain is much improved today   - Open reduction internal fixation (ORIF) ankle fracture (Left) on 10/20. Post op day #1  - tolerating pain well  - holding heparin subq for >24hrs due to bleeding in surgery. Give ASA/SCD's   # HTN - Known history. BP stable - Con't home Amlodipine 10mg , HCTZ 25mg    # UC - Stable for now, but recent flare as an outpatient. Followed by Dr. Dulce Sellar. He increased her medication and added on a new medication within the last several weeks.  - Con't home medications including PPI  # Thyroid disease - Last TSH in June 2014 was 0.692  - Con't home Levothyroxine daily   # Allergic rhinitis - Possible asthma component as well  - Flonase daily  - Zyrtec not on formulary, offered Claritin - Albuterol prn wheezing  # Hypokalemia - Resolved. Noted on admission, replete PO x1  FEN/GI: Normal diet today, NPO at midnight for procedure tomorrow PPx: Heparin SQ  Disposition: Surgery 03/14/13, anticipate d/c to SNF for short term rehab.  Subjective: Patient sitting in bed with no complaints overnight or this morning. Her pain is much relieved post surgery.   Objective: Temp:  [97.3 F (36.3 C)-98.4 F (36.9 C)] 98.4 F (36.9 C) (10/21 0536) Pulse Rate:  [69-85] 74 (10/21 0536) Resp:  [13-18] 18 (10/21 0536) BP:  (92-135)/(53-71) 103/63 mmHg (10/21 0536) SpO2:  [93 %-100 %] 100 % (10/21 0536) Weight:  [152 lb 1.9 oz (69 kg)] 152 lb 1.9 oz (69 kg) (10/20 0901)  Physical Exam: General: Reclined in bed, NAD. Pleasant and happy Cardiovascular: RRR, no murmur appreciated Respiratory: CTAB, good effort Abdomen: Soft, nontender Extremities: LLE in cast. Sensation intact in toes. 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 Right LE weakness.  Laboratory:  Recent Labs Lab 03/12/13 1726 03/15/13 0410  WBC 11.6* 8.0  HGB 11.6* 10.9*  HCT 36.3 33.7*  PLT 403* 387    Recent Labs Lab 03/12/13 1726 03/15/13 0410  NA 139 138  K 3.2* 4.5  CL 99 103  CO2 29 28  BUN 17 17  CREATININE 0.66 0.54  CALCIUM 9.5 9.2  PROT 8.1  --   BILITOT 0.6  --   ALKPHOS 79  --   ALT 15  --   AST 15  --   GLUCOSE 124* 121*   INR 0.99  Imaging/Diagnostic Tests: X-ray performed at urgent care and reviewed by Dr. Ophelia Charter. Not repeated.  Rachel Gandy, MD 03/15/2013, 8:23 AM PGY-3, [facility] FPTS Intern pager: [phone], text pages welcome

## 2013-03-16 DIAGNOSIS — J309 Allergic rhinitis, unspecified: Secondary | ICD-10-CM | POA: Diagnosis not present

## 2013-03-16 DIAGNOSIS — Z9181 History of falling: Secondary | ICD-10-CM | POA: Diagnosis not present

## 2013-03-16 DIAGNOSIS — E079 Disorder of thyroid, unspecified: Secondary | ICD-10-CM | POA: Diagnosis not present

## 2013-03-16 DIAGNOSIS — S8263XA Displaced fracture of lateral malleolus of unspecified fibula, initial encounter for closed fracture: Secondary | ICD-10-CM | POA: Diagnosis not present

## 2013-03-16 DIAGNOSIS — R269 Unspecified abnormalities of gait and mobility: Secondary | ICD-10-CM | POA: Diagnosis not present

## 2013-03-16 DIAGNOSIS — M6281 Muscle weakness (generalized): Secondary | ICD-10-CM | POA: Diagnosis not present

## 2013-03-16 DIAGNOSIS — I10 Essential (primary) hypertension: Secondary | ICD-10-CM | POA: Diagnosis not present

## 2013-03-16 DIAGNOSIS — S8290XD Unspecified fracture of unspecified lower leg, subsequent encounter for closed fracture with routine healing: Secondary | ICD-10-CM | POA: Diagnosis not present

## 2013-03-16 DIAGNOSIS — K219 Gastro-esophageal reflux disease without esophagitis: Secondary | ICD-10-CM | POA: Diagnosis not present

## 2013-03-16 DIAGNOSIS — E876 Hypokalemia: Secondary | ICD-10-CM | POA: Diagnosis not present

## 2013-03-16 DIAGNOSIS — K5190 Ulcerative colitis, unspecified, without complications: Secondary | ICD-10-CM | POA: Diagnosis not present

## 2013-03-16 DIAGNOSIS — S8290XS Unspecified fracture of unspecified lower leg, sequela: Secondary | ICD-10-CM | POA: Diagnosis not present

## 2013-03-16 DIAGNOSIS — E039 Hypothyroidism, unspecified: Secondary | ICD-10-CM | POA: Diagnosis not present

## 2013-03-16 DIAGNOSIS — Z4789 Encounter for other orthopedic aftercare: Secondary | ICD-10-CM | POA: Diagnosis not present

## 2013-03-16 MED ORDER — TRAMADOL HCL 50 MG PO TABS: 50.0000 mg | ORAL_TABLET | Freq: Four times a day (QID) | ORAL | Status: DC | PRN

## 2013-03-16 MED ORDER — CYCLOBENZAPRINE HCL 10 MG PO TABS: 5.0000 mg | ORAL_TABLET | Freq: Three times a day (TID) | ORAL | Status: DC | PRN

## 2013-03-16 MED ORDER — CYCLOBENZAPRINE HCL 5 MG PO TABS: 5.0000 mg | ORAL_TABLET | Freq: Every day | ORAL | Status: DC | PRN

## 2013-03-16 NOTE — Progress Notes (Signed)
Subjective: 2 Days Post-Op Procedure(s) (LRB): OPEN REDUCTION INTERNAL FIXATION (ORIF) ANKLE FRACTURE (Left) Patient reports pain as mild.    Objective: Vital signs in last 24 hours: Temp:  [97.4 F (36.3 C)-98.1 F (36.7 C)] 97.4 F (36.3 C) (10/22 0750) Pulse Rate:  [69-87] 69 (10/22 0750) Resp:  [16-17] 16 (10/22 0750) BP: (118-127)/(63-82) 127/63 mmHg (10/22 0750) SpO2:  [96 %-100 %] 100 % (10/22 0750)  Intake/Output from previous day: 10/21 0701 - 10/22 0700 In: 1060 [P.O.:1060] Out: -  Intake/Output this shift:     Recent Labs  03/15/13 0410  HGB 10.9*    Recent Labs  03/15/13 0410  WBC 8.0  RBC 3.69*  HCT 33.7*  PLT 387    Recent Labs  03/15/13 0410  NA 138  K 4.5  CL 103  CO2 28  BUN 17  CREATININE 0.54  GLUCOSE 121*  CALCIUM 9.2   No results found for this basename: LABPT, INR,  in the last 72 hours  splint intact.  cap refill and sensation of toes intact.  Assessment/Plan: 2 Days Post-Op Procedure(s) (LRB): OPEN REDUCTION INTERNAL FIXATION (ORIF) ANKLE FRACTURE (Left) Discharge to SNF  Ready for discharge today  OV Yates 2 weeks ASA 325mg  q d for VTE prophylaxis Elevation at rest.  Non weight bearing for ambulation with walker. Keep splint dry and clean at all times.  [name] M 03/16/2013, 8:17 AM

## 2013-03-16 NOTE — Progress Notes (Signed)
FMTS Attending Daily Note:  Renold Don MD  [phone] pager  Family Practice pager:  [phone] I have seen and examined this patient and have reviewed their chart. I have discussed this patient with the resident. I agree with the resident's findings, assessment and care plan.  Additionally:  Patient doing well.  Tramadol for pain.  OK for DC to SNF for rehab today.    Tobey Grim, MD 03/16/2013 1:24 PM

## 2013-03-16 NOTE — Progress Notes (Signed)
Family Medicine Teaching Service Daily Progress Note Intern Pager: [phone]  Patient name: [name] Medical record number: [id number] Date of birth: 05-28-1945 Age: 67 y.o. Gender: female  Primary Care Provider: Lucilla Edin, MD Consultants: Dr. Ophelia Charter - Ortho Code Status: Full Code  Pt Overview and Major Events to Date:  10/18 - Admitted with fibular fracture 10/20 - Surgery today at 3:00 pm   Assessment and Plan: EZMAE SPEERS is a 67 y.o. female presenting from orthopedic urgent care with left fibular fracture. PMH is significant for ulcerative colitis, HTN, allergies and thyroid disease.  # Fibular fracture -  She stated the pain hit her last night but she dealt with it.   - Open reduction internal fixation (ORIF) ankle fracture (Left) on 10/20. Post op day #2  - ASA 325 mg q daily for VTE prophylaxis.  - Ortho rec's: elevation at rest. Non weight bearing for ambulation with walker. Keep splint dry and clean at all times.   - She reports having a DEXA scan every two years previously and was put on Vit D and Calcium for this. Her last scan was roughly 2 years ago. She will follow up with Dr. Cleta Alberts in outpatient for possible scan in the future.   # HTN - Known history. BP stable - Con't home Amlodipine 10mg , HCTZ 25mg    # UC - Stable for now, but recent flare as an outpatient. Followed by Dr. Dulce Sellar. He increased her medication and added on a new medication within the last several weeks.  - Con't home medications including PPI - Dr. Dulce Sellar recommended avoiding an overabundance of NSAID use for pain control. An ASA a day would be ok for VTE prophylaxis. Tramadol would be good for pain control. Flexeril for muscle spasms as well.   # Thyroid disease - Last TSH in June 2014 was 0.692  - Con't home Levothyroxine daily   # Allergic rhinitis - Possible asthma component as well  - Flonase daily  - Zyrtec not on formulary, offered Claritin - Albuterol prn  wheezing  # Hypokalemia - Resolved. Noted on admission, replete PO x1  FEN/GI: Normal diet today, NPO at midnight for procedure tomorrow PPx: Heparin SQ  Disposition: Surgery 03/14/13, anticipate d/c to SNF for short term rehab.  Subjective: Patient sitting in bed with no complaints overnight or this morning. Her pain hit her last night at midnight followed by some muscle spasms. She tolerated the pain and they eventually dissipated.    Objective: Temp:  [97.4 F (36.3 C)-98.1 F (36.7 C)] 97.4 F (36.3 C) (10/22 0750) Pulse Rate:  [69-87] 69 (10/22 0750) Resp:  [16-17] 16 (10/22 0750) BP: (118-127)/(63-82) 127/63 mmHg (10/22 0750) SpO2:  [96 %-100 %] 100 % (10/22 0750)  Physical Exam: General: Reclined in bed, NAD. Pleasant and happy Cardiovascular: RRR, no murmur appreciated Respiratory: CTAB, good effort Abdomen: Soft, nontender Extremities: LLE in cast. Sensation intact in toes. 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 Right LE weakness.  Laboratory:  Recent Labs Lab 03/12/13 1726 03/15/13 0410  WBC 11.6* 8.0  HGB 11.6* 10.9*  HCT 36.3 33.7*  PLT 403* 387    Recent Labs Lab 03/12/13 1726 03/15/13 0410  NA 139 138  K 3.2* 4.5  CL 99 103  CO2 29 28  BUN 17 17  CREATININE 0.66 0.54  CALCIUM 9.5 9.2  PROT 8.1  --   BILITOT 0.6  --   ALKPHOS 79  --   ALT 15  --  AST 15  --   GLUCOSE 124* 121*   INR 0.99  Imaging/Diagnostic Tests: X-ray performed at urgent care and reviewed by Dr. Ophelia Charter. Not repeated.  Clare Gandy, MD 03/16/2013, 9:09 AM PGY-3, [facility] FPTS Intern pager: [phone], text pages welcome

## 2013-03-17 NOTE — Discharge Summary (Signed)
Family Medicine Teaching Service  Discharge Note : Attending Jeff Genevra Orne MD Pager [phone] Inpatient Team Pager:  [phone]  I have reviewed this patient and the patient's chart and have discussed discharge planning with the resident at the time of discharge. I agree with the discharge plan as above.    

## 2013-03-18 ENCOUNTER — Non-Acute Institutional Stay (SKILLED_NURSING_FACILITY): Payer: Medicare Other | Admitting: Internal Medicine

## 2013-03-18 ENCOUNTER — Telehealth: Payer: Self-pay | Admitting: Emergency Medicine

## 2013-03-18 DIAGNOSIS — E079 Disorder of thyroid, unspecified: Secondary | ICD-10-CM | POA: Diagnosis not present

## 2013-03-18 DIAGNOSIS — J309 Allergic rhinitis, unspecified: Secondary | ICD-10-CM

## 2013-03-18 DIAGNOSIS — S82402S Unspecified fracture of shaft of left fibula, sequela: Secondary | ICD-10-CM

## 2013-03-18 DIAGNOSIS — S8290XS Unspecified fracture of unspecified lower leg, sequela: Secondary | ICD-10-CM

## 2013-03-18 DIAGNOSIS — I10 Essential (primary) hypertension: Secondary | ICD-10-CM | POA: Diagnosis not present

## 2013-03-22 ENCOUNTER — Ambulatory Visit: Payer: 59 | Admitting: Emergency Medicine

## 2013-03-27 ENCOUNTER — Other Ambulatory Visit: Payer: Self-pay | Admitting: Emergency Medicine

## 2013-03-28 ENCOUNTER — Other Ambulatory Visit: Payer: Self-pay | Admitting: Emergency Medicine

## 2013-04-01 DIAGNOSIS — S8263XA Displaced fracture of lateral malleolus of unspecified fibula, initial encounter for closed fracture: Secondary | ICD-10-CM | POA: Diagnosis not present

## 2013-04-04 DIAGNOSIS — S8290XD Unspecified fracture of unspecified lower leg, subsequent encounter for closed fracture with routine healing: Secondary | ICD-10-CM | POA: Diagnosis not present

## 2013-04-04 DIAGNOSIS — M79609 Pain in unspecified limb: Secondary | ICD-10-CM | POA: Diagnosis not present

## 2013-04-04 DIAGNOSIS — I10 Essential (primary) hypertension: Secondary | ICD-10-CM | POA: Diagnosis not present

## 2013-04-05 DIAGNOSIS — S8290XD Unspecified fracture of unspecified lower leg, subsequent encounter for closed fracture with routine healing: Secondary | ICD-10-CM | POA: Diagnosis not present

## 2013-04-05 DIAGNOSIS — I10 Essential (primary) hypertension: Secondary | ICD-10-CM | POA: Diagnosis not present

## 2013-04-05 DIAGNOSIS — M79609 Pain in unspecified limb: Secondary | ICD-10-CM | POA: Diagnosis not present

## 2013-04-07 DIAGNOSIS — I10 Essential (primary) hypertension: Secondary | ICD-10-CM | POA: Diagnosis not present

## 2013-04-07 DIAGNOSIS — M79609 Pain in unspecified limb: Secondary | ICD-10-CM | POA: Diagnosis not present

## 2013-04-07 DIAGNOSIS — S8290XD Unspecified fracture of unspecified lower leg, subsequent encounter for closed fracture with routine healing: Secondary | ICD-10-CM | POA: Diagnosis not present

## 2013-04-12 DIAGNOSIS — S8290XD Unspecified fracture of unspecified lower leg, subsequent encounter for closed fracture with routine healing: Secondary | ICD-10-CM | POA: Diagnosis not present

## 2013-04-12 DIAGNOSIS — M79609 Pain in unspecified limb: Secondary | ICD-10-CM | POA: Diagnosis not present

## 2013-04-12 DIAGNOSIS — I10 Essential (primary) hypertension: Secondary | ICD-10-CM | POA: Diagnosis not present
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
## 2013-04-14 DIAGNOSIS — S8290XD Unspecified fracture of unspecified lower leg, subsequent encounter for closed fracture with routine healing: Secondary | ICD-10-CM | POA: Diagnosis not present

## 2013-04-14 DIAGNOSIS — M79609 Pain in unspecified limb: Secondary | ICD-10-CM | POA: Diagnosis not present

## 2013-04-14 DIAGNOSIS — I10 Essential (primary) hypertension: Secondary | ICD-10-CM | POA: Diagnosis not present

## 2013-04-18 DIAGNOSIS — M79609 Pain in unspecified limb: Secondary | ICD-10-CM | POA: Diagnosis not present

## 2013-04-18 DIAGNOSIS — S8290XD Unspecified fracture of unspecified lower leg, subsequent encounter for closed fracture with routine healing: Secondary | ICD-10-CM | POA: Diagnosis not present

## 2013-04-18 DIAGNOSIS — I10 Essential (primary) hypertension: Secondary | ICD-10-CM | POA: Diagnosis not present

## 2013-04-19 DIAGNOSIS — I10 Essential (primary) hypertension: Secondary | ICD-10-CM | POA: Diagnosis not present

## 2013-04-19 DIAGNOSIS — S8290XD Unspecified fracture of unspecified lower leg, subsequent encounter for closed fracture with routine healing: Secondary | ICD-10-CM | POA: Diagnosis not present

## 2013-04-19 DIAGNOSIS — M79609 Pain in unspecified limb: Secondary | ICD-10-CM | POA: Diagnosis not present

## 2013-04-25 ENCOUNTER — Other Ambulatory Visit: Payer: Self-pay | Admitting: Emergency Medicine

## 2013-04-25 ENCOUNTER — Other Ambulatory Visit: Payer: Self-pay | Admitting: Physician Assistant

## 2013-04-26 DIAGNOSIS — M79609 Pain in unspecified limb: Secondary | ICD-10-CM | POA: Diagnosis not present

## 2013-04-26 DIAGNOSIS — S8290XD Unspecified fracture of unspecified lower leg, subsequent encounter for closed fracture with routine healing: Secondary | ICD-10-CM | POA: Diagnosis not present

## 2013-04-26 DIAGNOSIS — I10 Essential (primary) hypertension: Secondary | ICD-10-CM | POA: Diagnosis not present

## 2013-04-27 DIAGNOSIS — S8290XD Unspecified fracture of unspecified lower leg, subsequent encounter for closed fracture with routine healing: Secondary | ICD-10-CM | POA: Diagnosis not present

## 2013-04-27 DIAGNOSIS — M79609 Pain in unspecified limb: Secondary | ICD-10-CM | POA: Diagnosis not present

## 2013-04-27 DIAGNOSIS — I10 Essential (primary) hypertension: Secondary | ICD-10-CM | POA: Diagnosis not present

## 2013-04-29 DIAGNOSIS — S8263XA Displaced fracture of lateral malleolus of unspecified fibula, initial encounter for closed fracture: Secondary | ICD-10-CM | POA: Diagnosis not present

## 2013-05-03 ENCOUNTER — Ambulatory Visit: Payer: Medicare Other | Admitting: Emergency Medicine

## 2013-05-03 DIAGNOSIS — M79609 Pain in unspecified limb: Secondary | ICD-10-CM | POA: Diagnosis not present

## 2013-05-03 DIAGNOSIS — I10 Essential (primary) hypertension: Secondary | ICD-10-CM | POA: Diagnosis not present

## 2013-05-03 DIAGNOSIS — S8290XD Unspecified fracture of unspecified lower leg, subsequent encounter for closed fracture with routine healing: Secondary | ICD-10-CM | POA: Diagnosis not present

## 2013-05-05 DIAGNOSIS — I10 Essential (primary) hypertension: Secondary | ICD-10-CM | POA: Diagnosis not present

## 2013-05-05 DIAGNOSIS — S8290XD Unspecified fracture of unspecified lower leg, subsequent encounter for closed fracture with routine healing: Secondary | ICD-10-CM | POA: Diagnosis not present

## 2013-05-05 DIAGNOSIS — M79609 Pain in unspecified limb: Secondary | ICD-10-CM | POA: Diagnosis not present

## 2013-05-10 DIAGNOSIS — S8290XD Unspecified fracture of unspecified lower leg, subsequent encounter for closed fracture with routine healing: Secondary | ICD-10-CM | POA: Diagnosis not present

## 2013-05-10 DIAGNOSIS — M79609 Pain in unspecified limb: Secondary | ICD-10-CM | POA: Diagnosis not present

## 2013-05-10 DIAGNOSIS — I10 Essential (primary) hypertension: Secondary | ICD-10-CM | POA: Diagnosis not present

## 2013-05-12 DIAGNOSIS — M79609 Pain in unspecified limb: Secondary | ICD-10-CM | POA: Diagnosis not present

## 2013-05-12 DIAGNOSIS — S8290XD Unspecified fracture of unspecified lower leg, subsequent encounter for closed fracture with routine healing: Secondary | ICD-10-CM | POA: Diagnosis not present

## 2013-05-12 DIAGNOSIS — I10 Essential (primary) hypertension: Secondary | ICD-10-CM | POA: Diagnosis not present

## 2013-05-13 ENCOUNTER — Other Ambulatory Visit: Payer: Self-pay | Admitting: Emergency Medicine

## 2013-05-17 ENCOUNTER — Other Ambulatory Visit: Payer: Self-pay | Admitting: Emergency Medicine

## 2013-05-17 ENCOUNTER — Ambulatory Visit (INDEPENDENT_AMBULATORY_CARE_PROVIDER_SITE_OTHER): Payer: Medicare Other | Admitting: Emergency Medicine

## 2013-05-17 VITALS — BP 147/86 | HR 80 | Temp 98.3°F | Resp 16 | Ht 64.0 in | Wt 151.0 lb

## 2013-05-17 DIAGNOSIS — E039 Hypothyroidism, unspecified: Secondary | ICD-10-CM

## 2013-05-17 DIAGNOSIS — I10 Essential (primary) hypertension: Secondary | ICD-10-CM | POA: Diagnosis not present

## 2013-05-17 DIAGNOSIS — R7989 Other specified abnormal findings of blood chemistry: Secondary | ICD-10-CM | POA: Diagnosis not present

## 2013-05-17 LAB — CBC WITH DIFFERENTIAL/PLATELET
Basophils Absolute: 0.1 10*3/uL (ref 0.0–0.1)
Basophils Relative: 1 % (ref 0–1)
Eosinophils Absolute: 0 10*3/uL (ref 0.0–0.7)
HCT: 38.6 % (ref 36.0–46.0)
MCH: 29.2 pg (ref 26.0–34.0)
MCHC: 33.4 g/dL (ref 30.0–36.0)
Monocytes Absolute: 0.7 10*3/uL (ref 0.1–1.0)
Neutro Abs: 4.4 10*3/uL (ref 1.7–7.7)
RDW: 13.8 % (ref 11.5–15.5)

## 2013-05-17 LAB — COMPLETE METABOLIC PANEL WITH GFR
ALT: 45 U/L — ABNORMAL HIGH (ref 0–35)
AST: 32 U/L (ref 0–37)
Albumin: 4 g/dL (ref 3.5–5.2)
Alkaline Phosphatase: 74 U/L (ref 39–117)
BUN: 14 mg/dL (ref 6–23)
Creat: 0.59 mg/dL (ref 0.50–1.10)
GFR, Est Non African American: >89 mL/min
Glucose, Bld: 88 mg/dL (ref 70–99)
Potassium: 3.7 mEq/L (ref 3.5–5.3)
Total Bilirubin: 0.5 mg/dL (ref 0.3–1.2)

## 2013-05-17 LAB — TSH: TSH: 0.841 u[IU]/mL (ref 0.350–4.500)

## 2013-05-17 MED ORDER — AMLODIPINE BESYLATE 10 MG PO TABS: 10.0000 mg | ORAL_TABLET | Freq: Every day | ORAL | Status: DC

## 2013-05-17 MED ORDER — POTASSIUM CHLORIDE ER 10 MEQ PO TBCR: 20.0000 meq | EXTENDED_RELEASE_TABLET | Freq: Every day | ORAL | Status: DC

## 2013-05-17 MED ORDER — SYNTHROID 112 MCG PO TABS: 112.0000 ug | ORAL_TABLET | Freq: Every day | ORAL | Status: DC

## 2013-05-17 MED ORDER — HYDROCHLOROTHIAZIDE 25 MG PO TABS: 25.0000 mg | ORAL_TABLET | Freq: Every day | ORAL | Status: DC

## 2013-05-17 NOTE — Progress Notes (Signed)
   Subjective:    Patient ID: [name], female    DOB: [birth year], 67 y.o.   MRN: [id number]  HPI 2 here for followup hypertension and thyroid disease with a known multinodular goiter. Since her last visit here she fell and fractured her left ankle and required open reduction and internal fixation. She is recovering nicely. She's been taking all medications as previously instructed. She denies chest pain shortness of breath heart any respiratory symptoms. She did have a flare of her colitis and has been to see her GI doctor.    Review of Systems     Objective:   Physical Exam HEENT exam reveals a large multinodular goiter. Her chest was clear heart regular rate no murmurs abdomen soft there is a cast on her left ankle and she walks with the assistance of a walker repeat blood pressure was 118/80.        Assessment & Plan:  All meds were refilled for one year. Routine labs were done. When she calls please schedule a mammogram and bone density study. Her last bone density was 2011.

## 2013-05-18 LAB — HEPATITIS C ANTIBODY: HCV Ab: NEGATIVE

## 2013-05-23 DIAGNOSIS — Z800 Family history of malignant neoplasm of digestive organs: Secondary | ICD-10-CM | POA: Diagnosis not present

## 2013-05-23 DIAGNOSIS — K5190 Ulcerative colitis, unspecified, without complications: Secondary | ICD-10-CM | POA: Diagnosis not present

## 2013-06-07 DIAGNOSIS — S8263XA Displaced fracture of lateral malleolus of unspecified fibula, initial encounter for closed fracture: Secondary | ICD-10-CM | POA: Diagnosis not present

## 2013-06-28 DIAGNOSIS — T84498A Other mechanical complication of other internal orthopedic devices, implants and grafts, initial encounter: Secondary | ICD-10-CM | POA: Diagnosis not present

## 2013-07-05 ENCOUNTER — Telehealth: Payer: Self-pay

## 2013-07-05 NOTE — Telephone Encounter (Signed)
Spoke with [address] and K-Dur is no longer available. He ran it through as generic and it was approved. Patient notified and voiced understanding.

## 2013-07-05 NOTE — Telephone Encounter (Signed)
PT STATES [employer] WILL NO LONGER PAY FOR HER POTASSIUM MEDICATION AND WANTED TO KNOW IF THERE WAS SOMETHING ELSE SHE COULD HAVE [place] CALL [phone]

## 2013-07-07 NOTE — Telephone Encounter (Signed)
A user error has taken place: encounter opened in error, closed for administrative reasons.

## 2013-07-18 DIAGNOSIS — R269 Unspecified abnormalities of gait and mobility: Secondary | ICD-10-CM | POA: Diagnosis not present

## 2013-07-18 DIAGNOSIS — M25579 Pain in unspecified ankle and joints of unspecified foot: Secondary | ICD-10-CM | POA: Diagnosis not present

## 2013-07-18 DIAGNOSIS — S82409A Unspecified fracture of shaft of unspecified fibula, initial encounter for closed fracture: Secondary | ICD-10-CM | POA: Diagnosis not present

## 2013-07-25 DIAGNOSIS — R269 Unspecified abnormalities of gait and mobility: Secondary | ICD-10-CM | POA: Diagnosis not present

## 2013-07-25 DIAGNOSIS — S82409A Unspecified fracture of shaft of unspecified fibula, initial encounter for closed fracture: Secondary | ICD-10-CM | POA: Diagnosis not present

## 2013-07-25 DIAGNOSIS — M25579 Pain in unspecified ankle and joints of unspecified foot: Secondary | ICD-10-CM | POA: Diagnosis not present

## 2013-08-01 DIAGNOSIS — M25579 Pain in unspecified ankle and joints of unspecified foot: Secondary | ICD-10-CM | POA: Diagnosis not present

## 2013-08-01 DIAGNOSIS — R269 Unspecified abnormalities of gait and mobility: Secondary | ICD-10-CM | POA: Diagnosis not present

## 2013-08-01 DIAGNOSIS — S82409A Unspecified fracture of shaft of unspecified fibula, initial encounter for closed fracture: Secondary | ICD-10-CM | POA: Diagnosis not present

## 2013-08-03 DIAGNOSIS — R269 Unspecified abnormalities of gait and mobility: Secondary | ICD-10-CM | POA: Diagnosis not present

## 2013-08-03 DIAGNOSIS — S82409A Unspecified fracture of shaft of unspecified fibula, initial encounter for closed fracture: Secondary | ICD-10-CM | POA: Diagnosis not present

## 2013-08-03 DIAGNOSIS — M25579 Pain in unspecified ankle and joints of unspecified foot: Secondary | ICD-10-CM | POA: Diagnosis not present

## 2013-08-04 ENCOUNTER — Encounter: Payer: Self-pay | Admitting: Podiatry

## 2013-08-04 ENCOUNTER — Ambulatory Visit (INDEPENDENT_AMBULATORY_CARE_PROVIDER_SITE_OTHER): Payer: 59 | Admitting: Podiatry

## 2013-08-04 ENCOUNTER — Ambulatory Visit (INDEPENDENT_AMBULATORY_CARE_PROVIDER_SITE_OTHER): Payer: Medicare Other

## 2013-08-04 VITALS — BP 150/86 | HR 72 | Resp 16

## 2013-08-04 DIAGNOSIS — M2020 Hallux rigidus, unspecified foot: Secondary | ICD-10-CM | POA: Diagnosis not present

## 2013-08-04 DIAGNOSIS — M2010 Hallux valgus (acquired), unspecified foot: Secondary | ICD-10-CM

## 2013-08-04 DIAGNOSIS — M779 Enthesopathy, unspecified: Secondary | ICD-10-CM

## 2013-08-04 MED ORDER — TRIAMCINOLONE ACETONIDE 10 MG/ML IJ SUSP
10.0000 mg | Freq: Once | INTRAMUSCULAR | Status: AC
  Administered 2013-08-04: 10 mg

## 2013-08-06 NOTE — Progress Notes (Signed)
Subjective:     Patient ID: [name], female   DOB: Aug 02, 1945, 68 y.o.   MRN: [id number]  HPI patient presents stating I'm having problems with the big toe joint of my left foot along with structural changes around it. I bumped it a while ago and it's been hurting me percent   Review of Systems     Objective:   Physical Exam Neurovascular status intact with no health history changes noted and I noted there to be inflammation and pain in the left first MPJ with pain when I try to move the joint mostly on the lateral side along with prominence of the medial side    Assessment:     What appears to be a structural hallux limitus HAV deformity left 1 and #2 inflammatory capsulitis of the interior of the joint surface    Plan:     H&P and x-rays reviewed with education rendered to patient. Did injection of the lateral side of the joint surface 3 mg Kenalog 5 mg Xylocaine Marcaine mixture and advised on physical therapy and rigid bottom shoes. Explained the nature of bunion and considerations for correction

## 2013-08-09 DIAGNOSIS — S82409A Unspecified fracture of shaft of unspecified fibula, initial encounter for closed fracture: Secondary | ICD-10-CM | POA: Diagnosis not present

## 2013-08-09 DIAGNOSIS — M25579 Pain in unspecified ankle and joints of unspecified foot: Secondary | ICD-10-CM | POA: Diagnosis not present

## 2013-08-09 DIAGNOSIS — R269 Unspecified abnormalities of gait and mobility: Secondary | ICD-10-CM | POA: Diagnosis not present

## 2013-08-10 DIAGNOSIS — M25579 Pain in unspecified ankle and joints of unspecified foot: Secondary | ICD-10-CM | POA: Diagnosis not present

## 2013-08-10 DIAGNOSIS — R269 Unspecified abnormalities of gait and mobility: Secondary | ICD-10-CM | POA: Diagnosis not present

## 2013-08-10 DIAGNOSIS — S82409A Unspecified fracture of shaft of unspecified fibula, initial encounter for closed fracture: Secondary | ICD-10-CM | POA: Diagnosis not present

## 2013-08-16 DIAGNOSIS — H43399 Other vitreous opacities, unspecified eye: Secondary | ICD-10-CM | POA: Diagnosis not present

## 2013-08-18 DIAGNOSIS — R269 Unspecified abnormalities of gait and mobility: Secondary | ICD-10-CM | POA: Diagnosis not present

## 2013-08-18 DIAGNOSIS — M25579 Pain in unspecified ankle and joints of unspecified foot: Secondary | ICD-10-CM | POA: Diagnosis not present

## 2013-08-18 DIAGNOSIS — S82409A Unspecified fracture of shaft of unspecified fibula, initial encounter for closed fracture: Secondary | ICD-10-CM | POA: Diagnosis not present

## 2013-08-23 DIAGNOSIS — Z780 Asymptomatic menopausal state: Secondary | ICD-10-CM | POA: Diagnosis not present

## 2013-08-23 DIAGNOSIS — M25579 Pain in unspecified ankle and joints of unspecified foot: Secondary | ICD-10-CM | POA: Diagnosis not present

## 2013-08-23 DIAGNOSIS — Z803 Family history of malignant neoplasm of breast: Secondary | ICD-10-CM | POA: Diagnosis not present

## 2013-08-23 DIAGNOSIS — Z1231 Encounter for screening mammogram for malignant neoplasm of breast: Secondary | ICD-10-CM | POA: Diagnosis not present

## 2013-08-23 DIAGNOSIS — R269 Unspecified abnormalities of gait and mobility: Secondary | ICD-10-CM | POA: Diagnosis not present

## 2013-08-23 DIAGNOSIS — S82409A Unspecified fracture of shaft of unspecified fibula, initial encounter for closed fracture: Secondary | ICD-10-CM | POA: Diagnosis not present

## 2013-08-25 DIAGNOSIS — M25579 Pain in unspecified ankle and joints of unspecified foot: Secondary | ICD-10-CM | POA: Diagnosis not present

## 2013-08-25 DIAGNOSIS — R269 Unspecified abnormalities of gait and mobility: Secondary | ICD-10-CM | POA: Diagnosis not present

## 2013-08-27 ENCOUNTER — Telehealth: Payer: Self-pay

## 2013-08-27 NOTE — Telephone Encounter (Signed)
We got pt's bone density scan back. Per Dr. Everlene Farrier, called pt and left a message on her machine that it came back ok and to continue the same dosages of vitamin d and calcium.

## 2013-08-30 DIAGNOSIS — R269 Unspecified abnormalities of gait and mobility: Secondary | ICD-10-CM | POA: Diagnosis not present

## 2013-08-30 DIAGNOSIS — S82409A Unspecified fracture of shaft of unspecified fibula, initial encounter for closed fracture: Secondary | ICD-10-CM | POA: Diagnosis not present

## 2013-08-30 DIAGNOSIS — M25579 Pain in unspecified ankle and joints of unspecified foot: Secondary | ICD-10-CM | POA: Diagnosis not present

## 2013-09-01 DIAGNOSIS — R269 Unspecified abnormalities of gait and mobility: Secondary | ICD-10-CM | POA: Diagnosis not present

## 2013-09-01 DIAGNOSIS — M25579 Pain in unspecified ankle and joints of unspecified foot: Secondary | ICD-10-CM | POA: Diagnosis not present

## 2013-09-01 DIAGNOSIS — S82409A Unspecified fracture of shaft of unspecified fibula, initial encounter for closed fracture: Secondary | ICD-10-CM | POA: Diagnosis not present

## 2013-09-06 DIAGNOSIS — S82409A Unspecified fracture of shaft of unspecified fibula, initial encounter for closed fracture: Secondary | ICD-10-CM | POA: Diagnosis not present

## 2013-09-06 DIAGNOSIS — R269 Unspecified abnormalities of gait and mobility: Secondary | ICD-10-CM | POA: Diagnosis not present

## 2013-09-06 DIAGNOSIS — M25579 Pain in unspecified ankle and joints of unspecified foot: Secondary | ICD-10-CM | POA: Diagnosis not present

## 2013-09-08 DIAGNOSIS — M25579 Pain in unspecified ankle and joints of unspecified foot: Secondary | ICD-10-CM | POA: Diagnosis not present

## 2013-09-08 DIAGNOSIS — R269 Unspecified abnormalities of gait and mobility: Secondary | ICD-10-CM | POA: Diagnosis not present

## 2013-09-13 DIAGNOSIS — R269 Unspecified abnormalities of gait and mobility: Secondary | ICD-10-CM | POA: Diagnosis not present

## 2013-09-13 DIAGNOSIS — M25579 Pain in unspecified ankle and joints of unspecified foot: Secondary | ICD-10-CM | POA: Diagnosis not present

## 2013-09-15 ENCOUNTER — Encounter: Payer: Self-pay | Admitting: Podiatry

## 2013-09-15 ENCOUNTER — Ambulatory Visit (INDEPENDENT_AMBULATORY_CARE_PROVIDER_SITE_OTHER): Payer: 59 | Admitting: Podiatry

## 2013-09-15 VITALS — BP 136/82 | HR 67 | Resp 16 | Ht 64.0 in | Wt 146.0 lb

## 2013-09-15 DIAGNOSIS — S82409A Unspecified fracture of shaft of unspecified fibula, initial encounter for closed fracture: Secondary | ICD-10-CM | POA: Diagnosis not present

## 2013-09-15 DIAGNOSIS — M25579 Pain in unspecified ankle and joints of unspecified foot: Secondary | ICD-10-CM | POA: Diagnosis not present

## 2013-09-15 DIAGNOSIS — M779 Enthesopathy, unspecified: Secondary | ICD-10-CM | POA: Diagnosis not present

## 2013-09-15 DIAGNOSIS — M2020 Hallux rigidus, unspecified foot: Secondary | ICD-10-CM

## 2013-09-15 DIAGNOSIS — R269 Unspecified abnormalities of gait and mobility: Secondary | ICD-10-CM | POA: Diagnosis not present

## 2013-09-15 NOTE — Progress Notes (Signed)
Subjective:     Patient ID: [name], female   DOB: Sep 09, 1945, 68 y.o.   MRN: [id number]  HPI patient states my left foot is a lot better in the joint and able to walk with minimal discomfort   Review of Systems     Objective:   Physical Exam Neurovascular status intact with significant diminishment of discomfort in the lateral side of the first MPJ with hallux limitus still noted upon motion    Assessment:     Hallux limitus condition with capsulitis which is improved with previous injection    Plan:     Advised on physical therapy rigid bottom shoes and the fact we may need to do occasional injections in the future. Reappoint as needed

## 2013-09-16 ENCOUNTER — Encounter: Payer: Self-pay | Admitting: Emergency Medicine

## 2013-09-20 ENCOUNTER — Encounter: Payer: Self-pay | Admitting: Emergency Medicine

## 2013-09-20 ENCOUNTER — Ambulatory Visit (INDEPENDENT_AMBULATORY_CARE_PROVIDER_SITE_OTHER): Payer: Medicare Other | Admitting: Emergency Medicine

## 2013-09-20 VITALS — BP 142/84 | HR 73 | Temp 98.5°F | Resp 16 | Ht 64.5 in | Wt 145.8 lb

## 2013-09-20 DIAGNOSIS — E039 Hypothyroidism, unspecified: Secondary | ICD-10-CM

## 2013-09-20 DIAGNOSIS — E049 Nontoxic goiter, unspecified: Secondary | ICD-10-CM | POA: Diagnosis not present

## 2013-09-20 DIAGNOSIS — I10 Essential (primary) hypertension: Secondary | ICD-10-CM | POA: Diagnosis not present

## 2013-09-20 LAB — BASIC METABOLIC PANEL
BUN: 15 mg/dL (ref 6–23)
CHLORIDE: 103 meq/L (ref 96–112)
CO2: 27 mEq/L (ref 19–32)
CREATININE: 0.56 mg/dL (ref 0.50–1.10)
Calcium: 9.6 mg/dL (ref 8.4–10.5)
Glucose, Bld: 92 mg/dL (ref 70–99)
Potassium: 3.5 mEq/L (ref 3.5–5.3)
Sodium: 140 mEq/L (ref 135–145)

## 2013-09-20 LAB — CBC WITH DIFFERENTIAL/PLATELET
BASOS PCT: 0 % (ref 0–1)
Basophils Absolute: 0 10*3/uL (ref 0.0–0.1)
EOS PCT: 0 % (ref 0–5)
Eosinophils Absolute: 0 10*3/uL (ref 0.0–0.7)
HEMATOCRIT: 39.6 % (ref 36.0–46.0)
Hemoglobin: 13.3 g/dL (ref 12.0–15.0)
LYMPHS PCT: 31 % (ref 12–46)
Lymphs Abs: 2.6 10*3/uL (ref 0.7–4.0)
MCH: 29 pg (ref 26.0–34.0)
MCHC: 33.6 g/dL (ref 30.0–36.0)
MCV: 86.5 fL (ref 78.0–100.0)
MONO ABS: 0.5 10*3/uL (ref 0.1–1.0)
Monocytes Relative: 6 % (ref 3–12)
Neutro Abs: 5.4 10*3/uL (ref 1.7–7.7)
Neutrophils Relative %: 63 % (ref 43–77)
Platelets: 395 10*3/uL (ref 150–400)
RBC: 4.58 MIL/uL (ref 3.87–5.11)
RDW: 14.2 % (ref 11.5–15.5)
WBC: 8.5 10*3/uL (ref 4.0–10.5)

## 2013-09-20 MED ORDER — ALBUTEROL SULFATE HFA 108 (90 BASE) MCG/ACT IN AERS: 2.0000 | INHALATION_SPRAY | Freq: Four times a day (QID) | RESPIRATORY_TRACT | Status: DC | PRN

## 2013-09-20 NOTE — Progress Notes (Signed)
   This chart was scribed for Darlyne Russian, MD by Elby Beck, Scribe. This patient was seen in room 22 and the patient's care was started at 1:13 PM.  Subjective:    Patient ID: [name], female    DOB: Oct 10, 1945, 68 y.o.   MRN: [id number]  Chief Complaint  Patient presents with  . Follow-up    4 MONTHS - BLOOD PRESSURE   HPI  HPI Comments: [name] is a 68 y.o. female with a history of HTN who presents to [facility] for a follow-up evaluation of her blood pressure. She states that she has been taking her 10 mg Amlodipine tablets and 25 mg HCTZ tablets as prescribed. Her blood pressure was taken to be 142/84 here today. Her manual blood pressure taken by me today were 130/82 in her left arm, and 130/80 in her right arm.She believes that it has been better controlled and lower than this on average. She states that she is also taking Potassium daily. She has a history of ulcerative colitis, and notes that she has been having some flare-ups of this recently. She reports that he has been drinking large amounts of Gatorade, as this seems to help reduce the symptoms of her ulcerative colitis. She mentions that she sees her GI doctor every 6 months. She reports that her next visit is in June 2015. She denies having any other symptoms.    Review of Systems     Objective:   Physical Exam  CONSTITUTIONAL: Well developed/well nourished HEAD: Normocephalic/atraumatic EYES: EOMI/PERRL ENMT: Mucous membranes moist NECK: supple no meningeal signs there is a large multinodular goiter on exam SPINE:entire spine nontender CV: S1/S2 noted, no murmurs/rubs/gallops noted LUNGS: Lungs are clear to auscultation bilaterally, no apparent distress ABDOMEN: soft, nontender, no rebound or guarding GU:no cva tenderness NEURO: Pt is awake/alert, moves all extremitiesx4 EXTREMITIES: pulses normal, full ROM SKIN: warm, color normal PSYCH: no abnormalities of mood  noted     BP 142/84  Pulse 73  Temp(Src) 98.5 F (36.9 C) (Oral)  Resp 16  Ht 5' 4.5" (1.638 m)  Wt 145 lb 12.8 oz (66.134 kg)  BMI 24.65 kg/m2  SpO2 99% Assessment & Plan:  Patient is stable at present. Repeat blood pressure 130/82. There'll be no change in medication. She will followup with her GI doctor and with her ENT for her goiter. Labs were done to check on her potassium.  I personally performed the services described in this documentation, which was scribed in my presence. The recorded information has been reviewed and is accurate.

## 2013-09-21 DIAGNOSIS — M25579 Pain in unspecified ankle and joints of unspecified foot: Secondary | ICD-10-CM | POA: Diagnosis not present

## 2013-09-21 DIAGNOSIS — R269 Unspecified abnormalities of gait and mobility: Secondary | ICD-10-CM | POA: Diagnosis not present

## 2013-09-21 DIAGNOSIS — S82409A Unspecified fracture of shaft of unspecified fibula, initial encounter for closed fracture: Secondary | ICD-10-CM | POA: Diagnosis not present

## 2013-09-21 LAB — TSH: TSH: 2.812 u[IU]/mL (ref 0.350–4.500)

## 2013-09-23 ENCOUNTER — Encounter: Payer: Self-pay | Admitting: Emergency Medicine

## 2013-09-23 DIAGNOSIS — R269 Unspecified abnormalities of gait and mobility: Secondary | ICD-10-CM | POA: Diagnosis not present

## 2013-09-23 DIAGNOSIS — S82409A Unspecified fracture of shaft of unspecified fibula, initial encounter for closed fracture: Secondary | ICD-10-CM | POA: Diagnosis not present

## 2013-09-23 DIAGNOSIS — M25579 Pain in unspecified ankle and joints of unspecified foot: Secondary | ICD-10-CM | POA: Diagnosis not present

## 2013-09-27 DIAGNOSIS — S82409A Unspecified fracture of shaft of unspecified fibula, initial encounter for closed fracture: Secondary | ICD-10-CM | POA: Diagnosis not present

## 2013-09-27 DIAGNOSIS — M25579 Pain in unspecified ankle and joints of unspecified foot: Secondary | ICD-10-CM | POA: Diagnosis not present

## 2013-09-27 DIAGNOSIS — R269 Unspecified abnormalities of gait and mobility: Secondary | ICD-10-CM | POA: Diagnosis not present

## 2013-09-29 DIAGNOSIS — S82409A Unspecified fracture of shaft of unspecified fibula, initial encounter for closed fracture: Secondary | ICD-10-CM | POA: Diagnosis not present

## 2013-09-29 DIAGNOSIS — M25579 Pain in unspecified ankle and joints of unspecified foot: Secondary | ICD-10-CM | POA: Diagnosis not present

## 2013-09-29 DIAGNOSIS — R269 Unspecified abnormalities of gait and mobility: Secondary | ICD-10-CM | POA: Diagnosis not present

## 2013-10-04 DIAGNOSIS — S82409A Unspecified fracture of shaft of unspecified fibula, initial encounter for closed fracture: Secondary | ICD-10-CM | POA: Diagnosis not present

## 2013-10-04 DIAGNOSIS — M25579 Pain in unspecified ankle and joints of unspecified foot: Secondary | ICD-10-CM | POA: Diagnosis not present

## 2013-10-04 DIAGNOSIS — R269 Unspecified abnormalities of gait and mobility: Secondary | ICD-10-CM | POA: Diagnosis not present

## 2013-11-09 ENCOUNTER — Other Ambulatory Visit: Payer: Self-pay

## 2013-11-09 MED ORDER — MONTELUKAST SODIUM 10 MG PO TABS: 10.0000 mg | ORAL_TABLET | Freq: Every day | ORAL | Status: DC

## 2013-11-09 NOTE — Telephone Encounter (Signed)
Pharm reqs RFs of montelukast. Dr Everlene Farrier, pt had check up in Apr, but don't see AR discussed. Can we RF?

## 2013-11-15 DIAGNOSIS — Z800 Family history of malignant neoplasm of digestive organs: Secondary | ICD-10-CM | POA: Diagnosis not present

## 2013-11-15 DIAGNOSIS — K5190 Ulcerative colitis, unspecified, without complications: Secondary | ICD-10-CM | POA: Diagnosis not present

## 2013-11-21 DIAGNOSIS — Z800 Family history of malignant neoplasm of digestive organs: Secondary | ICD-10-CM | POA: Diagnosis not present

## 2013-12-02 ENCOUNTER — Encounter: Payer: Self-pay | Admitting: Emergency Medicine

## 2014-01-17 ENCOUNTER — Ambulatory Visit: Payer: Medicare Other | Admitting: Emergency Medicine

## 2014-01-27 ENCOUNTER — Ambulatory Visit (INDEPENDENT_AMBULATORY_CARE_PROVIDER_SITE_OTHER): Payer: Medicare Other | Admitting: Emergency Medicine

## 2014-01-27 ENCOUNTER — Encounter: Payer: Self-pay | Admitting: Emergency Medicine

## 2014-01-27 VITALS — BP 130/78 | HR 79 | Temp 98.4°F | Resp 16 | Ht 63.75 in | Wt 147.2 lb

## 2014-01-27 DIAGNOSIS — E781 Pure hyperglyceridemia: Secondary | ICD-10-CM

## 2014-01-27 DIAGNOSIS — Z23 Encounter for immunization: Secondary | ICD-10-CM

## 2014-01-27 DIAGNOSIS — I10 Essential (primary) hypertension: Secondary | ICD-10-CM | POA: Diagnosis not present

## 2014-01-27 DIAGNOSIS — E785 Hyperlipidemia, unspecified: Secondary | ICD-10-CM | POA: Diagnosis not present

## 2014-01-27 DIAGNOSIS — E049 Nontoxic goiter, unspecified: Secondary | ICD-10-CM

## 2014-01-27 DIAGNOSIS — E039 Hypothyroidism, unspecified: Secondary | ICD-10-CM

## 2014-01-27 LAB — CBC WITH DIFFERENTIAL/PLATELET
Basophils Absolute: 0.1 10*3/uL (ref 0.0–0.1)
Basophils Relative: 1 % (ref 0–1)
EOS ABS: 0 10*3/uL (ref 0.0–0.7)
EOS PCT: 0 % (ref 0–5)
HCT: 40 % (ref 36.0–46.0)
Hemoglobin: 13.4 g/dL (ref 12.0–15.0)
LYMPHS PCT: 28 % (ref 12–46)
Lymphs Abs: 2 10*3/uL (ref 0.7–4.0)
MCH: 28.9 pg (ref 26.0–34.0)
MCHC: 33.5 g/dL (ref 30.0–36.0)
MCV: 86.2 fL (ref 78.0–100.0)
Monocytes Absolute: 0.5 10*3/uL (ref 0.1–1.0)
Monocytes Relative: 7 % (ref 3–12)
Neutro Abs: 4.5 10*3/uL (ref 1.7–7.7)
Neutrophils Relative %: 64 % (ref 43–77)
PLATELETS: 413 10*3/uL — AB (ref 150–400)
RBC: 4.64 MIL/uL (ref 3.87–5.11)
RDW: 14.1 % (ref 11.5–15.5)
WBC: 7 10*3/uL (ref 4.0–10.5)

## 2014-01-27 LAB — COMPLETE METABOLIC PANEL WITH GFR
ALT: 14 U/L (ref 0–35)
AST: 15 U/L (ref 0–37)
Albumin: 4.2 g/dL (ref 3.5–5.2)
Alkaline Phosphatase: 84 U/L (ref 39–117)
BILIRUBIN TOTAL: 0.6 mg/dL (ref 0.2–1.2)
BUN: 16 mg/dL (ref 6–23)
CHLORIDE: 102 meq/L (ref 96–112)
CO2: 28 mEq/L (ref 19–32)
CREATININE: 0.61 mg/dL (ref 0.50–1.10)
Calcium: 10 mg/dL (ref 8.4–10.5)
GFR, Est African American: >89 mL/min
GFR, Est Non African American: >89 mL/min
Glucose, Bld: 103 mg/dL — ABNORMAL HIGH (ref 70–99)
Potassium: 3.6 mEq/L (ref 3.5–5.3)
Sodium: 138 mEq/L (ref 135–145)
Total Protein: 7.9 g/dL (ref 6.0–8.3)

## 2014-01-27 LAB — LIPID PANEL
Cholesterol: 198 mg/dL (ref 0–200)
HDL: 34 mg/dL — AB (ref >39)
LDL CALC: 120 mg/dL — AB (ref 0–99)
TRIGLYCERIDES: 218 mg/dL — AB (ref <150)
Total CHOL/HDL Ratio: 5.8 Ratio
VLDL: 44 mg/dL — AB (ref 0–40)

## 2014-01-27 LAB — T4, FREE: Free T4: 1.59 ng/dL (ref 0.80–1.80)

## 2014-01-27 LAB — TSH: TSH: 0.788 u[IU]/mL (ref 0.350–4.500)

## 2014-01-27 NOTE — Progress Notes (Signed)
   Subjective:    Patient ID: [name], female    DOB: 1945-12-13, 68 y.o.   MRN: [id number]  HPI patient had a followup hypothyroidism with a large goiter hypertension, and colitis. She is doing well. She recently had a colonoscopy and it appeared clear. She is up-to-date on her mammograms. She needs a flu vaccine for this year    Review of Systems     Objective:   Physical Exam Patient is alert and cooperative she is in no distress. Her neck is supple. Chest was clear to both auscultation and percussion. Heart regular rate no murmurs. The abdomen has a full sensation in right lower abdomen. No masses are felt the       Assessment & Plan:  Her pressure is at goal. She was given Prevnar and flu vaccine. Routine labs were done. She was advised to followup with her thyroid doctor

## 2014-03-27 ENCOUNTER — Encounter: Payer: Self-pay | Admitting: Emergency Medicine

## 2014-05-02 DIAGNOSIS — E041 Nontoxic single thyroid nodule: Secondary | ICD-10-CM | POA: Diagnosis not present

## 2014-05-26 DIAGNOSIS — C50919 Malignant neoplasm of unspecified site of unspecified female breast: Secondary | ICD-10-CM

## 2014-05-26 HISTORY — DX: Malignant neoplasm of unspecified site of unspecified female breast: C50.919

## 2014-05-30 ENCOUNTER — Encounter: Payer: Self-pay | Admitting: Emergency Medicine

## 2014-05-30 ENCOUNTER — Ambulatory Visit (INDEPENDENT_AMBULATORY_CARE_PROVIDER_SITE_OTHER): Payer: Medicare Other | Admitting: Emergency Medicine

## 2014-05-30 VITALS — BP 138/77 | HR 80 | Temp 98.1°F | Resp 16 | Ht 64.5 in | Wt 150.0 lb

## 2014-05-30 DIAGNOSIS — E038 Other specified hypothyroidism: Secondary | ICD-10-CM | POA: Diagnosis not present

## 2014-05-30 DIAGNOSIS — E781 Pure hyperglyceridemia: Secondary | ICD-10-CM | POA: Diagnosis not present

## 2014-05-30 DIAGNOSIS — I10 Essential (primary) hypertension: Secondary | ICD-10-CM | POA: Diagnosis not present

## 2014-05-30 DIAGNOSIS — Z9109 Other allergy status, other than to drugs and biological substances: Secondary | ICD-10-CM

## 2014-05-30 DIAGNOSIS — Z91048 Other nonmedicinal substance allergy status: Secondary | ICD-10-CM

## 2014-05-30 DIAGNOSIS — E049 Nontoxic goiter, unspecified: Secondary | ICD-10-CM | POA: Diagnosis not present

## 2014-05-30 LAB — CBC WITH DIFFERENTIAL/PLATELET
Basophils Absolute: 0.1 10*3/uL (ref 0.0–0.1)
Basophils Relative: 1 % (ref 0–1)
Eosinophils Absolute: 0 10*3/uL (ref 0.0–0.7)
Eosinophils Relative: 0 % (ref 0–5)
HCT: 38 % (ref 36.0–46.0)
Hemoglobin: 12.7 g/dL (ref 12.0–15.0)
LYMPHS ABS: 2.4 10*3/uL (ref 0.7–4.0)
Lymphocytes Relative: 30 % (ref 12–46)
MCH: 28.8 pg (ref 26.0–34.0)
MCHC: 33.4 g/dL (ref 30.0–36.0)
MCV: 86.2 fL (ref 78.0–100.0)
MPV: 9.3 fL (ref 8.6–12.4)
Monocytes Absolute: 0.5 10*3/uL (ref 0.1–1.0)
Monocytes Relative: 6 % (ref 3–12)
Neutro Abs: 5 10*3/uL (ref 1.7–7.7)
Neutrophils Relative %: 63 % (ref 43–77)
PLATELETS: 446 10*3/uL — AB (ref 150–400)
RBC: 4.41 MIL/uL (ref 3.87–5.11)
RDW: 13.7 % (ref 11.5–15.5)
WBC: 8 10*3/uL (ref 4.0–10.5)

## 2014-05-30 LAB — BASIC METABOLIC PANEL
BUN: 15 mg/dL (ref 6–23)
CO2: 26 mEq/L (ref 19–32)
Calcium: 9.5 mg/dL (ref 8.4–10.5)
Chloride: 103 mEq/L (ref 96–112)
Creat: 0.52 mg/dL (ref 0.50–1.10)
Glucose, Bld: 86 mg/dL (ref 70–99)
POTASSIUM: 3.7 meq/L (ref 3.5–5.3)
Sodium: 138 mEq/L (ref 135–145)

## 2014-05-30 LAB — LIPID PANEL
Cholesterol: 209 mg/dL — ABNORMAL HIGH (ref 0–200)
HDL: 35 mg/dL — AB (ref >39)
LDL CALC: 147 mg/dL — AB (ref 0–99)
Total CHOL/HDL Ratio: 6 Ratio
Triglycerides: 134 mg/dL (ref <150)
VLDL: 27 mg/dL (ref 0–40)

## 2014-05-30 LAB — TSH: TSH: 0.368 u[IU]/mL (ref 0.350–4.500)

## 2014-05-30 LAB — T4, FREE: Free T4: 1.49 ng/dL (ref 0.80–1.80)

## 2014-05-30 MED ORDER — SYNTHROID 112 MCG PO TABS: 112.0000 ug | ORAL_TABLET | Freq: Every day | ORAL | Status: DC

## 2014-05-30 MED ORDER — HYDROCHLOROTHIAZIDE 25 MG PO TABS: 25.0000 mg | ORAL_TABLET | Freq: Every day | ORAL | Status: DC

## 2014-05-30 MED ORDER — KLOR-CON 10 10 MEQ PO TBCR: 20.0000 meq | EXTENDED_RELEASE_TABLET | Freq: Every day | ORAL | Status: DC

## 2014-05-30 MED ORDER — AMLODIPINE BESYLATE 10 MG PO TABS: 10.0000 mg | ORAL_TABLET | Freq: Every day | ORAL | Status: DC

## 2014-05-30 MED ORDER — MONTELUKAST SODIUM 10 MG PO TABS: 10.0000 mg | ORAL_TABLET | Freq: Every day | ORAL | Status: DC
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
## 2014-05-31 ENCOUNTER — Other Ambulatory Visit: Payer: Self-pay | Admitting: Family Medicine

## 2014-05-31 DIAGNOSIS — E785 Hyperlipidemia, unspecified: Secondary | ICD-10-CM

## 2014-05-31 MED ORDER — ATORVASTATIN CALCIUM 40 MG PO TABS: 40.0000 mg | ORAL_TABLET | Freq: Every day | ORAL | Status: DC

## 2014-06-02 ENCOUNTER — Encounter: Payer: Self-pay | Admitting: Gynecology

## 2014-06-02 ENCOUNTER — Ambulatory Visit (INDEPENDENT_AMBULATORY_CARE_PROVIDER_SITE_OTHER): Payer: Medicare Other | Admitting: Gynecology

## 2014-06-02 VITALS — BP 136/84 | Ht 64.0 in | Wt 151.0 lb

## 2014-06-02 DIAGNOSIS — Z8781 Personal history of (healed) traumatic fracture: Secondary | ICD-10-CM | POA: Diagnosis not present

## 2014-06-02 DIAGNOSIS — M8580 Other specified disorders of bone density and structure, unspecified site: Secondary | ICD-10-CM | POA: Diagnosis not present

## 2014-06-02 DIAGNOSIS — Z01419 Encounter for gynecological examination (general) (routine) without abnormal findings: Secondary | ICD-10-CM | POA: Diagnosis not present
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
## 2014-06-08 ENCOUNTER — Telehealth: Payer: Self-pay | Admitting: *Deleted

## 2014-06-08 NOTE — Telephone Encounter (Signed)
Dr.Fernandez patient called back to follow up from telephone encounter from early today. She said that [facility] informed her the the VFA test will not be paid by insurance. She will have to pay out of pocket and states she does'nt have the money to do this.  Pt is going to cancel appointment.

## 2014-06-08 NOTE — Telephone Encounter (Signed)
Okay please document 

## 2014-06-08 NOTE — Telephone Encounter (Signed)
-----   Message from Terrance Mass, MD sent at 06/02/2014 11:53 AM EST ----- Anderson Malta, I need you  to to call [facility] in reference to this patient. Tell them that I would like for them to schedule a vertebral fracture assessment. I also need for them to do a comparison study from 2011 with the study of 2015 because the report was incomplete and there was no Frax analysis. I did do them to make an apparent for her to see me in consultation week after the vertebral fracture assessment has been completed. Please contact patient was scheduled dates in time of both of the above. Thank you

## 2014-06-08 NOTE — Telephone Encounter (Signed)
Appointment for the below on 06/13/14 @ 10:30 am at [facility],order faxed. Pt informed,

## 2014-06-09 NOTE — Telephone Encounter (Signed)
That I will be fine. Decision Medicare copy of my note

## 2014-06-09 NOTE — Telephone Encounter (Signed)
Pt said she spoke with medicare and was informed that they will pay for test in April but you will need to speak with medicare and tell them this is medical necessary. Pt will cancel appointment and reschedule for April.

## 2014-06-09 NOTE — Telephone Encounter (Signed)
Left the below on pt voicemail. 

## 2014-06-15 ENCOUNTER — Ambulatory Visit: Payer: Medicare Other | Admitting: Gynecology

## 2014-06-19 ENCOUNTER — Encounter: Payer: Self-pay | Admitting: Obstetrics and Gynecology

## 2014-06-22 ENCOUNTER — Telehealth: Payer: Self-pay

## 2014-06-22 NOTE — Telephone Encounter (Signed)
She has been advised to stop taking medication. She states she has and the rash has resolved.  Please advise change of medication.

## 2014-06-22 NOTE — Telephone Encounter (Signed)
She would like Dr. Everlene Farrier to know she didn't take atorvastatin (LIPITOR) 40 MG tablet [[id number]] yesterday because it is making her very tired. It has given her a  rash so she took Benadryl . She wants Dr. Everlene Farrier to know so he might decrease the dosage or change her medication. Please advise at [phone]

## 2014-06-23 NOTE — Telephone Encounter (Signed)
Spoke with pt, advised message from Dr. Everlene Farrier. Pt understood and will make an appt to see Dr. Everlene Farrier in one month.

## 2014-06-23 NOTE — Telephone Encounter (Signed)
Please advised to stay off the medication. Plan on seeing me sometime in the next month and we can discuss alternative treatments. In the meantime she needs to be on a low-fat diet. Please add atorvastatin to her allergy list.

## 2014-07-04 ENCOUNTER — Other Ambulatory Visit: Payer: Self-pay | Admitting: Emergency Medicine

## 2014-07-18 ENCOUNTER — Ambulatory Visit (INDEPENDENT_AMBULATORY_CARE_PROVIDER_SITE_OTHER): Payer: Medicare Other | Admitting: Emergency Medicine

## 2014-07-18 ENCOUNTER — Encounter: Payer: Self-pay | Admitting: Emergency Medicine

## 2014-07-18 VITALS — BP 162/80 | HR 96 | Temp 97.8°F | Resp 16 | Ht 64.5 in | Wt 149.0 lb

## 2014-07-18 DIAGNOSIS — E785 Hyperlipidemia, unspecified: Secondary | ICD-10-CM

## 2014-07-18 DIAGNOSIS — L270 Generalized skin eruption due to drugs and medicaments taken internally: Secondary | ICD-10-CM | POA: Diagnosis not present

## 2014-07-18 DIAGNOSIS — G950 Syringomyelia and syringobulbia: Secondary | ICD-10-CM | POA: Diagnosis not present

## 2014-07-18 LAB — LIPID PANEL
CHOLESTEROL: 193 mg/dL (ref 0–200)
HDL: 35 mg/dL — ABNORMAL LOW (ref >=46)
LDL Cholesterol: 136 mg/dL — ABNORMAL HIGH (ref 0–99)
Total CHOL/HDL Ratio: 5.5 Ratio
Triglycerides: 112 mg/dL (ref <150)
VLDL: 22 mg/dL (ref 0–40)

## 2014-07-18 LAB — CBC WITH DIFFERENTIAL/PLATELET
BASOS ABS: 0 10*3/uL (ref 0.0–0.1)
BASOS PCT: 0 % (ref 0–1)
EOS ABS: 0 10*3/uL (ref 0.0–0.7)
Eosinophils Relative: 0 % (ref 0–5)
HCT: 38.9 % (ref 36.0–46.0)
HEMOGLOBIN: 12.6 g/dL (ref 12.0–15.0)
LYMPHS ABS: 1.9 10*3/uL (ref 0.7–4.0)
Lymphocytes Relative: 23 % (ref 12–46)
MCH: 28.7 pg (ref 26.0–34.0)
MCHC: 32.4 g/dL (ref 30.0–36.0)
MCV: 88.6 fL (ref 78.0–100.0)
MPV: 9.4 fL (ref 8.6–12.4)
Monocytes Absolute: 0.4 10*3/uL (ref 0.1–1.0)
Monocytes Relative: 5 % (ref 3–12)
NEUTROS PCT: 72 % (ref 43–77)
Neutro Abs: 5.8 10*3/uL (ref 1.7–7.7)
Platelets: 437 10*3/uL — ABNORMAL HIGH (ref 150–400)
RBC: 4.39 MIL/uL (ref 3.87–5.11)
RDW: 13.6 % (ref 11.5–15.5)
WBC: 8.1 10*3/uL (ref 4.0–10.5)
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
## 2014-07-18 NOTE — Progress Notes (Deleted)
   Subjective:    Patient ID: [name], female    DOB: 1945-06-24, 69 y.o.   MRN: [id number]  HPI    Review of Systems     Objective:   Physical Exam        Assessment & Plan:

## 2014-07-18 NOTE — Patient Instructions (Signed)
Flax seed  Oil        Cholesterol Cholesterol is a white, waxy, fat-like substance needed by your body in small amounts. The liver makes all the cholesterol you need. Cholesterol is carried from the liver by the blood through the blood vessels. Deposits of cholesterol (plaque) may build up on blood vessel walls. These make the arteries narrower and stiffer. Cholesterol plaques increase the risk for heart attack and stroke.  You cannot feel your cholesterol level even if it is very high. The only way to know it is high is with a blood test. Once you know your cholesterol levels, you should keep a record of the test results. Work with your health care provider to keep your levels in the desired range.  WHAT DO THE RESULTS MEAN?  Total cholesterol is a rough measure of all the cholesterol in your blood.   LDL is the so-called bad cholesterol. This is the type that deposits cholesterol in the walls of the arteries. You want this level to be low.   HDL is the good cholesterol because it cleans the arteries and carries the LDL away. You want this level to be high.  Triglycerides are fat that the body can either burn for energy or store. High levels are closely linked to heart disease.  WHAT ARE THE DESIRED LEVELS OF CHOLESTEROL?  Total cholesterol below 200.   LDL below 100 for people at risk, below 70 for those at very high risk.   HDL above 50 is good, above 60 is best.   Triglycerides below 150.  HOW CAN I LOWER MY CHOLESTEROL?  Diet. Follow your diet programs as directed by your health care provider.   Choose fish or white meat chicken and [place], roasted or baked. Limit fatty cuts of red meat, fried foods, and processed meats, such as sausage and lunch meats.   Eat lots of fresh fruits and vegetables.  Choose whole grains, beans, pasta, potatoes, and cereals.   Use only small amounts of olive, corn, or canola oils.   Avoid butter, mayonnaise, shortening, or palm kernel  oils.  Avoid foods with trans fats.   Drink skim or nonfat milk and eat low-fat or nonfat yogurt and cheeses. Avoid whole milk, cream, ice cream, egg yolks, and full-fat cheeses.   Healthy desserts include angel food cake, ginger snaps, animal crackers, hard candy, popsicles, and low-fat or nonfat frozen yogurt. Avoid pastries, cakes, pies, and cookies.   Exercise. Follow your exercise programs as directed by your health care provider.   A regular program helps decrease LDL and raise HDL.   A regular program helps with weight control.   Do things that increase your activity level like gardening, walking, or taking the stairs. Ask your health care provider about how you can be more active in your daily life.   Medicine. Take medicine only as directed by your health care provider.   Medicine may be prescribed by your health care provider to help lower cholesterol and decrease the risk for heart disease.   If you have several risk factors, you may need medicine even if your levels are normal. Document Released: 02/04/2001 Document Revised: 09/26/2013 Document Reviewed: 02/23/2013 [facility] Patient Information 2015 [place], [place]. This information is not intended to replace advice given to you by your health care provider. Make sure you discuss any questions you have with your health care provider.

## 2014-07-26 DIAGNOSIS — M1712 Unilateral primary osteoarthritis, left knee: Secondary | ICD-10-CM | POA: Diagnosis not present

## 2014-07-26 DIAGNOSIS — M25562 Pain in left knee: Secondary | ICD-10-CM | POA: Diagnosis not present

## 2014-07-27 ENCOUNTER — Other Ambulatory Visit: Payer: Self-pay | Admitting: Emergency Medicine

## 2014-08-02 DIAGNOSIS — G959 Disease of spinal cord, unspecified: Secondary | ICD-10-CM | POA: Diagnosis not present

## 2014-08-02 DIAGNOSIS — M47816 Spondylosis without myelopathy or radiculopathy, lumbar region: Secondary | ICD-10-CM | POA: Diagnosis not present

## 2014-08-02 DIAGNOSIS — M549 Dorsalgia, unspecified: Secondary | ICD-10-CM | POA: Diagnosis not present

## 2014-08-02 DIAGNOSIS — M5136 Other intervertebral disc degeneration, lumbar region: Secondary | ICD-10-CM | POA: Diagnosis not present

## 2014-08-02 DIAGNOSIS — M4722 Other spondylosis with radiculopathy, cervical region: Secondary | ICD-10-CM | POA: Diagnosis not present

## 2014-08-02 DIAGNOSIS — M4155 Other secondary scoliosis, thoracolumbar region: Secondary | ICD-10-CM | POA: Diagnosis not present

## 2014-08-02 DIAGNOSIS — I10 Essential (primary) hypertension: Secondary | ICD-10-CM | POA: Diagnosis not present

## 2014-08-02 DIAGNOSIS — M542 Cervicalgia: Secondary | ICD-10-CM | POA: Diagnosis not present

## 2014-08-02 DIAGNOSIS — Z6825 Body mass index (BMI) 25.0-25.9, adult: Secondary | ICD-10-CM | POA: Diagnosis not present

## 2014-08-02 DIAGNOSIS — M546 Pain in thoracic spine: Secondary | ICD-10-CM | POA: Diagnosis not present

## 2014-08-02 DIAGNOSIS — M5030 Other cervical disc degeneration, unspecified cervical region: Secondary | ICD-10-CM | POA: Diagnosis not present

## 2014-09-22 DIAGNOSIS — H43812 Vitreous degeneration, left eye: Secondary | ICD-10-CM | POA: Diagnosis not present

## 2014-09-22 DIAGNOSIS — H25013 Cortical age-related cataract, bilateral: Secondary | ICD-10-CM | POA: Diagnosis not present

## 2014-09-26 ENCOUNTER — Ambulatory Visit: Payer: Medicare Other | Admitting: Emergency Medicine

## 2014-10-12 ENCOUNTER — Ambulatory Visit (INDEPENDENT_AMBULATORY_CARE_PROVIDER_SITE_OTHER): Payer: Medicare Other | Admitting: Emergency Medicine

## 2014-10-12 ENCOUNTER — Encounter: Payer: Self-pay | Admitting: Emergency Medicine

## 2014-10-12 VITALS — BP 120/80 | HR 77 | Temp 97.8°F | Resp 16 | Ht 65.0 in | Wt 149.0 lb

## 2014-10-12 DIAGNOSIS — I10 Essential (primary) hypertension: Secondary | ICD-10-CM | POA: Diagnosis not present

## 2014-10-12 DIAGNOSIS — E785 Hyperlipidemia, unspecified: Secondary | ICD-10-CM

## 2014-10-12 DIAGNOSIS — E049 Nontoxic goiter, unspecified: Secondary | ICD-10-CM | POA: Diagnosis not present

## 2014-10-12 DIAGNOSIS — L270 Generalized skin eruption due to drugs and medicaments taken internally: Secondary | ICD-10-CM | POA: Diagnosis not present

## 2014-10-12 DIAGNOSIS — E781 Pure hyperglyceridemia: Secondary | ICD-10-CM | POA: Diagnosis not present

## 2014-10-12 LAB — CBC WITH DIFFERENTIAL/PLATELET
BASOS ABS: 0.1 10*3/uL (ref 0.0–0.1)
BASOS PCT: 1 % (ref 0–1)
Eosinophils Absolute: 0 10*3/uL (ref 0.0–0.7)
Eosinophils Relative: 0 % (ref 0–5)
HCT: 37.3 % (ref 36.0–46.0)
Hemoglobin: 12.3 g/dL (ref 12.0–15.0)
Lymphocytes Relative: 27 % (ref 12–46)
Lymphs Abs: 1.9 10*3/uL (ref 0.7–4.0)
MCH: 28.3 pg (ref 26.0–34.0)
MCHC: 33 g/dL (ref 30.0–36.0)
MCV: 85.9 fL (ref 78.0–100.0)
MONO ABS: 0.4 10*3/uL (ref 0.1–1.0)
MPV: 9.3 fL (ref 8.6–12.4)
Monocytes Relative: 6 % (ref 3–12)
Neutro Abs: 4.7 10*3/uL (ref 1.7–7.7)
Neutrophils Relative %: 66 % (ref 43–77)
PLATELETS: 419 10*3/uL — AB (ref 150–400)
RBC: 4.34 MIL/uL (ref 3.87–5.11)
RDW: 13.6 % (ref 11.5–15.5)
WBC: 7.1 10*3/uL (ref 4.0–10.5)

## 2014-10-12 LAB — BASIC METABOLIC PANEL WITH GFR
BUN: 13 mg/dL (ref 6–23)
CALCIUM: 9.4 mg/dL (ref 8.4–10.5)
CHLORIDE: 101 meq/L (ref 96–112)
CO2: 29 mEq/L (ref 19–32)
CREATININE: 0.49 mg/dL — AB (ref 0.50–1.10)
GFR, Est Non African American: >89 mL/min
GLUCOSE: 98 mg/dL (ref 70–99)
Potassium: 3.4 mEq/L — ABNORMAL LOW (ref 3.5–5.3)
Sodium: 137 mEq/L (ref 135–145)

## 2014-10-12 LAB — LIPID PANEL
Cholesterol: 200 mg/dL (ref 0–200)
HDL: 32 mg/dL — ABNORMAL LOW (ref >=46)
LDL Cholesterol: 135 mg/dL — ABNORMAL HIGH (ref 0–99)
Total CHOL/HDL Ratio: 6.3 Ratio
Triglycerides: 165 mg/dL — ABNORMAL HIGH (ref <150)
VLDL: 33 mg/dL (ref 0–40)

## 2014-10-12 NOTE — Progress Notes (Addendum)
   Subjective:  This chart was scribed for Nena Jordan, MD by [facility], medical scribe at [facility].The patient was seen in exam room 22 and the patient's care was started at 10:31 AM.   Patient ID: [name], female    DOB: [birth year], 69 y.o.   MRN: [id number] Chief Complaint  Patient presents with  . Hypertension    4 month follow up   HPI HPI Comments: [name] is a 69 y.o. female who presents to [facility] for a hypertension four month follow up. Doing and feeling well. No abdominal pain, she has occasional flare up she is taking flax oil  Hypertension: Pt takes amlodipine 10 mg daily and HCTZ 25 mg daily.   Dental work: Root crack in her front tooth and having an implant.  Thyroid: Biopsy was normal and she has this checked yearly.  Hyperlipidemia:  Pt still has occasional abdominal pain flare ups,  when she eats fatty foods. She has taken flax oil for relief and has been watching what she eat. She has tried Lipitor but developed a rash within a week of taking the medicine.  Review of Systems  Cardiovascular: Negative for chest pain and leg swelling.  Gastrointestinal: Negative for abdominal pain.       Objective:  BP 148/81 mmHg  Pulse 77  Temp(Src) 97.8 F (36.6 C) (Oral)  Resp 16  Ht 5\' 5"  (1.651 m)  Wt 149 lb (67.586 kg)  BMI 24.79 kg/m2  SpO2 98% Physical Exam  Nursing note and vitals reviewed. CONSTITUTIONAL: Well developed/well nourished HEAD: Normocephalic/atraumatic EYES: EOMI/PERRL ENMT: Mucous membranes moist NECK: supple no meningeal signs. A large multi nodular goiter.  SPINE/BACK:entire spine nontender CV: S1/S2 noted, no murmurs/rubs/gallops noted LUNGS: Lungs are clear to auscultation bilaterally, no apparent distress ABDOMEN: soft, nontender, no rebound or guarding, bowel sounds noted throughout abdomen GU:no cva tenderness NEURO: Pt is awake/alert/appropriate, moves all  extremitiesx4.  No facial droop.   EXTREMITIES: pulses normal/equal, full ROM SKIN: warm, color normal PSYCH: no abnormalities of mood noted, alert and oriented to situation.  BP Readings from Last 3 Encounters:  10/12/14 148/81  07/18/14 162/80  06/02/14 136/84   Lab Results  Component Value Date   CHOL 193 07/18/2014   HDL 35* 07/18/2014   LDLCALC 136* 07/18/2014   TRIG 112 07/18/2014   CHOLHDL 5.5 07/18/2014       Assessment & Plan:      1. Hyperlipemia - Lipid panel patient had a reaction to Lipitor 2 weeks after starting that medication and would not be a candidate to take a statin.  2. Rash, drug   3. Hypertriglyceridemia  - Lipid panel  4. Essential hypertension  - BASIC METABOLIC PANEL WITH GFR - CBC with Differential/Platelet Repeat blood pressure was at  goal 5. Goiter She is under the care of ENT for this. She had a biopsy done in December L follow-up through our office regarding this.   I personally performed the services described in this documentation, which was scribed in my presence. The recorded information has been reviewed and is accurate.  Arlyss Queen, MD  [facility]  10/12/2014 10:43 AM

## 2014-10-18 MED ORDER — EZETIMIBE 10 MG PO TABS: 10.0000 mg | ORAL_TABLET | Freq: Every day | ORAL | Status: DC

## 2014-10-18 NOTE — Addendum Note (Signed)
Addended by: Constance Goltz on: 10/18/2014 10:26 AM   Modules accepted: Orders, SmartSet

## 2014-12-08 DIAGNOSIS — K5100 Ulcerative (chronic) pancolitis without complications: Secondary | ICD-10-CM | POA: Diagnosis not present

## 2015-01-09 DIAGNOSIS — Z1231 Encounter for screening mammogram for malignant neoplasm of breast: Secondary | ICD-10-CM | POA: Diagnosis not present

## 2015-01-12 DIAGNOSIS — R928 Other abnormal and inconclusive findings on diagnostic imaging of breast: Secondary | ICD-10-CM | POA: Diagnosis not present

## 2015-01-12 DIAGNOSIS — R922 Inconclusive mammogram: Secondary | ICD-10-CM | POA: Diagnosis not present

## 2015-01-18 ENCOUNTER — Other Ambulatory Visit: Payer: Self-pay | Admitting: Radiology

## 2015-01-18 DIAGNOSIS — C50911 Malignant neoplasm of unspecified site of right female breast: Secondary | ICD-10-CM | POA: Diagnosis not present

## 2015-01-18 DIAGNOSIS — N63 Unspecified lump in breast: Secondary | ICD-10-CM | POA: Diagnosis not present

## 2015-01-18 DIAGNOSIS — Z803 Family history of malignant neoplasm of breast: Secondary | ICD-10-CM | POA: Diagnosis not present

## 2015-01-18 DIAGNOSIS — D0591 Unspecified type of carcinoma in situ of right breast: Secondary | ICD-10-CM | POA: Diagnosis not present

## 2015-01-18 DIAGNOSIS — Z0000 Encounter for general adult medical examination without abnormal findings: Secondary | ICD-10-CM | POA: Diagnosis not present

## 2015-01-19 ENCOUNTER — Telehealth: Payer: Self-pay | Admitting: Family Medicine

## 2015-01-19 NOTE — Telephone Encounter (Signed)
Results from [facility] showed she needed an Ultrasound. Spoke to patient and she had the Ultrasound done yesterday along with a BX, they also placed markers for potential future surgery. [facility] will send the results of the BX and the Ultrasound.

## 2015-01-22 ENCOUNTER — Encounter: Payer: Self-pay | Admitting: *Deleted

## 2015-01-22 ENCOUNTER — Telehealth: Payer: Self-pay | Admitting: *Deleted

## 2015-01-22 DIAGNOSIS — C50411 Malignant neoplasm of upper-outer quadrant of right female breast: Secondary | ICD-10-CM

## 2015-01-22 HISTORY — DX: Malignant neoplasm of upper-outer quadrant of right female breast: C50.411

## 2015-01-22 NOTE — Telephone Encounter (Signed)
Confirmed [facility] for 01/24/15 at 1230pm .  Instructions and contact information given.

## 2015-01-24 ENCOUNTER — Encounter: Payer: Self-pay | Admitting: Hematology and Oncology

## 2015-01-24 ENCOUNTER — Ambulatory Visit: Payer: Medicare Other

## 2015-01-24 ENCOUNTER — Other Ambulatory Visit (HOSPITAL_BASED_OUTPATIENT_CLINIC_OR_DEPARTMENT_OTHER): Payer: Medicare Other

## 2015-01-24 ENCOUNTER — Ambulatory Visit: Payer: Medicare Other | Attending: General Surgery | Admitting: Physical Therapy

## 2015-01-24 ENCOUNTER — Ambulatory Visit
Admission: RE | Admit: 2015-01-24 | Discharge: 2015-01-24 | Disposition: A | Discharge status: Home / Self Care | Payer: Medicare Other | Source: Ambulatory Visit | Attending: Radiation Oncology | Admitting: Radiation Oncology

## 2015-01-24 ENCOUNTER — Encounter: Payer: Self-pay | Admitting: General Practice

## 2015-01-24 ENCOUNTER — Ambulatory Visit (HOSPITAL_BASED_OUTPATIENT_CLINIC_OR_DEPARTMENT_OTHER): Payer: Medicare Other | Admitting: Hematology and Oncology

## 2015-01-24 ENCOUNTER — Other Ambulatory Visit: Payer: Self-pay | Admitting: General Surgery

## 2015-01-24 ENCOUNTER — Encounter: Payer: Self-pay | Admitting: Nurse Practitioner

## 2015-01-24 ENCOUNTER — Encounter: Payer: Self-pay | Admitting: Physical Therapy

## 2015-01-24 VITALS — BP 146/85 | HR 86 | Temp 98.4°F | Resp 20 | Ht 65.0 in | Wt 146.9 lb

## 2015-01-24 DIAGNOSIS — C50411 Malignant neoplasm of upper-outer quadrant of right female breast: Secondary | ICD-10-CM

## 2015-01-24 DIAGNOSIS — R293 Abnormal posture: Secondary | ICD-10-CM | POA: Insufficient documentation

## 2015-01-24 DIAGNOSIS — M25511 Pain in right shoulder: Secondary | ICD-10-CM | POA: Insufficient documentation

## 2015-01-24 DIAGNOSIS — Z9181 History of falling: Secondary | ICD-10-CM | POA: Insufficient documentation

## 2015-01-24 LAB — CBC WITH DIFFERENTIAL/PLATELET
BASO%: 0.3 % (ref 0.0–2.0)
BASOS ABS: 0 10*3/uL (ref 0.0–0.1)
EOS ABS: 0 10*3/uL (ref 0.0–0.5)
EOS%: 0 % (ref 0.0–7.0)
HEMATOCRIT: 39.5 % (ref 34.8–46.6)
HEMOGLOBIN: 13 g/dL (ref 11.6–15.9)
LYMPH#: 2.7 10*3/uL (ref 0.9–3.3)
LYMPH%: 28.5 % (ref 14.0–49.7)
MCH: 29.3 pg (ref 25.1–34.0)
MCHC: 32.9 g/dL (ref 31.5–36.0)
MCV: 89 fL (ref 79.5–101.0)
MONO#: 0.5 10*3/uL (ref 0.1–0.9)
MONO%: 5.3 % (ref 0.0–14.0)
NEUT%: 65.9 % (ref 38.4–76.8)
NEUTROS ABS: 6.2 10*3/uL (ref 1.5–6.5)
Platelets: 395 10*3/uL (ref 145–400)
RBC: 4.44 10*6/uL (ref 3.70–5.45)
RDW: 13.5 % (ref 11.2–14.5)
WBC: 9.5 10*3/uL (ref 3.9–10.3)

## 2015-01-24 LAB — COMPREHENSIVE METABOLIC PANEL (CC13)
ALBUMIN: 3.9 g/dL (ref 3.5–5.0)
ALK PHOS: 85 U/L (ref 40–150)
ALT: 12 U/L (ref 0–55)
AST: 15 U/L (ref 5–34)
Anion Gap: 8 mEq/L (ref 3–11)
BILIRUBIN TOTAL: 0.43 mg/dL (ref 0.20–1.20)
BUN: 16.2 mg/dL (ref 7.0–26.0)
CALCIUM: 10 mg/dL (ref 8.4–10.4)
CO2: 31 mEq/L — ABNORMAL HIGH (ref 22–29)
Chloride: 102 mEq/L (ref 98–109)
Creatinine: 0.8 mg/dL (ref 0.6–1.1)
GLUCOSE: 97 mg/dL (ref 70–140)
Potassium: 3.4 mEq/L — ABNORMAL LOW (ref 3.5–5.1)
SODIUM: 141 meq/L (ref 136–145)
TOTAL PROTEIN: 8.4 g/dL — AB (ref 6.4–8.3)

## 2015-01-24 NOTE — Progress Notes (Signed)
[facility] Psychosocial Distress Screening Spiritual Care  Accompanied by Vaughan Sine, Counseling Intern, met with Ms Schepp, daughter Vito Backers, and friend Fraser Din at [facility] to introduce [facility] team/resources, reviewing distress screen per protocol.  The patient scored a 4 on the Psychosocial Distress Thermometer which indicates moderate distress. Also assessed for distress and other psychosocial needs.   ONCBCN DISTRESS SCREENING 01/24/2015  Screening Type Initial Screening  Distress experienced in past week (1-10) 4  Emotional problem type Nervousness/Anxiety;Adjusting to illness  Referral to support programs Yes  Other Spiritual Care, Counseling Intern Vaughan Sine   Per pt, she feels comfortable with the dx/tx information she received at [facility] and is eager to progress through treatment.  Provided introduction to services, pastoral presence, reflective listening, normalization of feelings, and information about support available for her whole family (including other daughter, not present, whom pt describes as anxious/eager to be involved).     Follow up needed: No.  Pt and family are aware of ongoing Support Team availability and contact info.  They report no other questions or needs at this time.  Please also page as needs arise.  [place], [place] Pager [phone] Voicemail  [phone]

## 2015-01-24 NOTE — Progress Notes (Signed)
[name] is a very pleasant 69 y.o. 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  Biopsy results revealed the tumor's prognostic profile is ER positive, PR positive, and HER2/neu negative. Ki67 is 30%.  She presents today with her daughter and friend to the [facility]) for treatment consideration and recommendations from the breast surgeon, radiation oncologist, and medical oncologist.     I briefly met with [name], her daughter, and her friend during [facility] visit today. We discussed the purpose of the Survivorship Clinic, which will include monitoring for recurrence, coordinating completion of age and gender-appropriate cancer screenings, promotion of overall wellness, as well as managing potential late/long-term side effects of anti-cancer treatments.    The treatment plan for [name] will likely include surgery, radiation therapy, and anti-estrogen therapy.  She will meet with the Genetics Counselor due to her family history of breast cancer. As of today, the intent of treatment for [name] is cure, therefore she will be eligible for the Survivorship Clinic upon her completion of treatment.  Her survivorship care plan (SCP) document will be drafted and updated throughout the course of her treatment trajectory. She will receive the SCP in an office visit with myself in the Survivorship Clinic once she has completed treatment.   [name] was encouraged to ask questions and all questions were answered to her satisfaction.  She was given my business card and encouraged to contact me with any concerns regarding survivorship.  I look forward to participating in her care.   Kenn File, [place] [phone]
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
## 2015-01-24 NOTE — Progress Notes (Signed)
Radiation Oncology         [phone] ________________________________  Name: [name] MRN: [id number]  Date: 01/24/2015  DOB: 1945-07-04  CV:ELFY, Lina Sayre, MD  Grace Isaac, MD     REFERRING PHYSICIAN: Grace Isaac, MD  DIAGNOSIS: The encounter diagnosis was Breast cancer of upper-outer quadrant of right female breast.  HISTORY OF PRESENT ILLNESS::[name] is a 69 y.o. female who is seen for an initial consultation visit regarding the patient's diagnosis of breast cancer.  The patient was found to have suspicious findings within the right breast on initial mammogram. The patient has not had symptoms prior to this study. A diagnostic mammogram and breast ultrasound confirmed this finding. On ultrasound, the tumor measured 1.6 cm and was present in the upper right quadrant.   A biopsy was performed. This revealed invasive ductal carcinoma with DCIS. Receptors studies were completed and indicate that the tumor is estrogen receptor positive, progesterone receptor positive, and Her-2/neu negative. The Ki-67 staining was 30%.   The patient has not undergone an MRI scan of the breasts.  The patient's menarche occurred at age 55. She is post-menopausal She has used hormone replacements for 10 years and discontinued at around age 32. She is GxP2 with her first live birth at age 9. She has used birth control pills or hormone shots for contraception.  PREVIOUS RADIATION THERAPY: No  PAST MEDICAL HISTORY:  has a past medical history of Bone fracture (2010); Colitis, ulcerative; Fibroid; Thyroid disease; Hypertension; Breast cancer of upper-outer quadrant of right female breast (01/22/2015); Arthritis; Hot flashes; and Breast cancer (2016).    PAST SURGICAL HISTORY: Past Surgical History  Procedure Laterality Date  . Vaginal hysterectomy  1994  . Hysteroscopy  1994  . Knee surgery    . Heel spurs    . Dilation and curettage of uterus  1994  . Orif ankle  fracture Left 03/14/2013    Procedure: OPEN REDUCTION INTERNAL FIXATION (ORIF) ANKLE FRACTURE;  Surgeon: Marybelle Killings, MD;  Location: [facility];  Service: Orthopedics;  Laterality: Left;    FAMILY HISTORY: family history includes Alzheimer's disease in her maternal aunt; Breast cancer in her cousin; Colon cancer in her paternal uncle; Colon cancer (age of onset: 46) in her father; Colon polyps in her sister; Diabetes in her maternal grandmother and paternal grandmother; Hypertension in her father and mother; Leukemia in her maternal uncle and mother.  SOCIAL HISTORY:  reports that she has never smoked. She has never used smokeless tobacco. She reports that she drinks alcohol. She reports that she does not use illicit drugs.  ALLERGIES: Contrast media and Silvadene  MEDICATIONS:  Current Outpatient Prescriptions  Medication Sig Dispense Refill  . amLODipine (NORVASC) 10 MG tablet Take 1 tablet (10 mg total) by mouth daily. 90 tablet 3  . aspirin EC 325 MG tablet Take 1 tablet (325 mg total) by mouth daily. 30 tablet 0  . Calcium Carbonate-Vitamin D (CALCIUM + D PO) Take 2 tablets by mouth daily.     . cetirizine (ZYRTEC) 10 MG tablet Take 10 mg by mouth daily as needed for allergies.    . cholecalciferol (VITAMIN D) 1000 UNITS tablet Take 1,000 Units by mouth daily.      Marland Kitchen ezetimibe (ZETIA) 10 MG tablet Take 1 tablet (10 mg total) by mouth daily. 30 tablet 11  . hydrochlorothiazide (HYDRODIURIL) 25 MG tablet Take 1 tablet (25 mg total) by mouth daily. 90 tablet 3  . KLOR-CON 10  10 MEQ tablet Take 2 tablets (20 mEq total) by mouth daily. 180 tablet 3  . mesalamine (LIALDA) 1.2 G EC tablet Take 4,800 mg by mouth daily.     . montelukast (SINGULAIR) 10 MG tablet Take 1 tablet (10 mg total) by mouth at bedtime. 90 tablet 3  . Multiple Vitamin (MULTIVITAMIN) tablet Take 1 tablet by mouth daily.     Marland Kitchen SYNTHROID 112 MCG tablet Take 1 tablet (112 mcg total) by mouth daily before breakfast. 90 tablet 3    Current Facility-Administered Medications  Medication Dose Route Frequency Provider Last Rate Last Dose  . albuterol (PROVENTIL) (2.5 MG/3ML) 0.083% nebulizer solution 2.5 mg  2.5 mg Nebulization Once Darlyne Russian, MD        REVIEW OF SYSTEMS:  A 15 point review of systems is documented in the electronic medical record. This was obtained by the nursing staff. However, I reviewed this with the patient to discuss relevant findings and make appropriate changes.  Pertinent items are noted in HPI.   PHYSICAL EXAM:  Vitals with Age-Percentiles 01/24/2015  Length 144.8 cm  Systolic 185  Diastolic 85  MAP   Pulse 86  Respiration 20  Weight 66.633 kg  BMI 24.5   ECOG = 0  0 - Asymptomatic (Fully active, able to carry on all predisease activities without restriction)  1 - Symptomatic but completely ambulatory (Restricted in physically strenuous activity but ambulatory and able to carry out work of a light or sedentary nature. For example, light housework, office work)  2 - Symptomatic, <50% in bed during the day (Ambulatory and capable of all self care but unable to carry out any work activities. Up and about more than 50% of waking hours)  3 - Symptomatic, >50% in bed, but not bedbound (Capable of only limited self-care, confined to bed or chair 50% or more of waking hours)  4 - Bedbound (Completely disabled. Cannot carry on any self-care. Totally confined to bed or chair)  5 - Death   Eustace Pen MM, Creech RH, Tormey DC, et al. [phone]). "Toxicity and response criteria of the [employer]". [place] Oncol. 5 (6): 649-55  General: Well-developed, in no acute distress HEENT: Normocephalic, atraumatic; oral cavity clear Neck: Supple without any lymphadenopathy Cardiovascular: Regular rate and rhythm Respiratory: Clear to auscultation bilaterally Breasts: Right breast has biopsy site in upper-right area with a palpable 2 cm mass near midline, no axillary lymph nodes  // Left breast is benign GI: Soft, nontender, normal bowel sounds Extremities: No edema present Neuro: No focal deficits   LABORATORY DATA:  Lab Results  Component Value Date   WBC 9.5 01/24/2015   HGB 13.0 01/24/2015   HCT 39.5 01/24/2015   MCV 89.0 01/24/2015   PLT 395 01/24/2015   Lab Results  Component Value Date   NA 141 01/24/2015   K 3.4* 01/24/2015   CL 101 10/12/2014   CO2 31* 01/24/2015   Lab Results  Component Value Date   ALT 12 01/24/2015   AST 15 01/24/2015   ALKPHOS 85 01/24/2015   BILITOT 0.43 01/24/2015      RADIOGRAPHY: No results found.     IMPRESSION:    Breast cancer of upper-outer quadrant of right female breast   01/18/2015 Initial Diagnosis Right breast biopsy: Invasive ductal cancer grade 2-3 with DCIS, HER-2 negative ratio 1.75, ER/PR pending; 1.6 cm mass at 11:00 position    The patient has a recent diagnosis of grade II-III invasive ductal  carcinoma of the right breast. She appears to be a good candidate for breast conservation treatment.   I discussed with the patient the role of adjuvant radiation treatment in this setting. We discussed the potential benefit of radiation treatment, especially with regards to local control of the patient's tumor. We also discussed the possible side effects and risks of such a treatment as well.  All of the patient's questions were answered.   PLAN: I look forward to seeing the patient postoperatively to review her case and further discuss and coordinate an anticipated course of radiation treatment. At this time, I anticipate a 4 week course of treatment.       ________________________________   [name], MD, PhD   **Disclaimer: This note was dictated with voice recognition software. Similar sounding words can inadvertently be transcribed and this note may contain transcription errors which may not have been corrected upon publication of note.**  This document serves as a record of services  personally performed by Kyung Rudd, MD. It was created on his behalf by Derek Mound, a trained medical scribe. The creation of this record is based on the scribe's personal observations and the provider's statements to them. This document has been checked and approved by the attending provider.

## 2015-01-24 NOTE — Progress Notes (Signed)
Checked in new br ca pt. Pt has 2 insurances and may not need my services. Pt brought packet with her. Pt has my card for any financial questions or concerns.

## 2015-01-24 NOTE — Progress Notes (Signed)
Note created by Dr. Gudena during office visit, copy to patient,original to scan. 

## 2015-01-24 NOTE — Patient Instructions (Signed)
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
## 2015-01-24 NOTE — Assessment & Plan Note (Addendum)
Right breast biopsy 01/18/2015: Invasive ductal cancer grade 2-3 with DCIS, HER-2 negative ratio 1.75, ER/PR pending; 1.6 cm mass at 11:00 position  Pathology and radiology counseling:Discussed with the patient, the details of pathology including the type of breast cancer,the clinical staging, the significance of ER, PR and HER-2/neu receptors and the implications for treatment. After reviewing the pathology in detail, we proceeded to discuss the different treatment options between surgery, radiation, chemotherapy, antiestrogen therapies.  Recommendations: 1. Breast conserving surgery followed by 2. Oncotype DX testing to determine if chemotherapy would be of any benefit followed by 3. Adjuvant radiation therapy followed by 4. Adjuvant antiestrogen therapy  Oncotype counseling: I discussed Oncotype DX test. I explained to the patient that this is a 21 gene panel to evaluate patient tumors DNA to calculate recurrence score. This would help determine whether patient has high risk or intermediate risk or low risk breast cancer. She understands that if her tumor was found to be high risk, she would benefit from systemic chemotherapy. If low risk, no need of chemotherapy. If she was found to be intermediate risk, we would need to evaluate the score as well as other risk factors and determine if an abbreviated chemotherapy may be of benefit.  Return to clinic after surgery to discuss final pathology report and then determine if Oncotype DX testing will need to be sent.

## 2015-01-25 ENCOUNTER — Encounter: Payer: Self-pay | Admitting: Genetic Counselor

## 2015-01-25 ENCOUNTER — Ambulatory Visit (HOSPITAL_BASED_OUTPATIENT_CLINIC_OR_DEPARTMENT_OTHER): Payer: Medicare Other | Admitting: Genetic Counselor

## 2015-01-25 ENCOUNTER — Other Ambulatory Visit: Payer: Medicare Other

## 2015-01-25 DIAGNOSIS — C50411 Malignant neoplasm of upper-outer quadrant of right female breast: Secondary | ICD-10-CM

## 2015-01-25 DIAGNOSIS — Z803 Family history of malignant neoplasm of breast: Secondary | ICD-10-CM | POA: Diagnosis not present

## 2015-01-25 DIAGNOSIS — Z800 Family history of malignant neoplasm of digestive organs: Secondary | ICD-10-CM

## 2015-01-25 DIAGNOSIS — Z806 Family history of leukemia: Secondary | ICD-10-CM

## 2015-01-25 DIAGNOSIS — Z315 Encounter for genetic counseling: Secondary | ICD-10-CM
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
## 2015-01-29 ENCOUNTER — Encounter: Payer: Self-pay | Admitting: Radiation Oncology

## 2015-01-30 ENCOUNTER — Encounter (HOSPITAL_BASED_OUTPATIENT_CLINIC_OR_DEPARTMENT_OTHER): Payer: Self-pay | Admitting: *Deleted

## 2015-01-31 ENCOUNTER — Other Ambulatory Visit: Payer: Self-pay | Admitting: *Deleted

## 2015-01-31 ENCOUNTER — Telehealth: Payer: Self-pay | Admitting: *Deleted

## 2015-01-31 ENCOUNTER — Telehealth: Payer: Self-pay | Admitting: Hematology and Oncology

## 2015-01-31 NOTE — Telephone Encounter (Signed)
Left message for a return phone call to follow up from [facility] 01/24/15.  Awaiting patient response.

## 2015-01-31 NOTE — Telephone Encounter (Signed)
lvm for pt regarding to sept appt... °

## 2015-02-01 ENCOUNTER — Telehealth: Payer: Self-pay | Admitting: Hematology and Oncology

## 2015-02-01 ENCOUNTER — Encounter: Payer: Self-pay | Admitting: Emergency Medicine

## 2015-02-01 ENCOUNTER — Encounter (HOSPITAL_BASED_OUTPATIENT_CLINIC_OR_DEPARTMENT_OTHER)
Admission: RE | Admit: 2015-02-01 | Discharge: 2015-02-01 | Disposition: A | Discharge status: Home / Self Care | Payer: Medicare Other | Source: Ambulatory Visit | Attending: General Surgery | Admitting: General Surgery

## 2015-02-01 ENCOUNTER — Other Ambulatory Visit: Payer: Self-pay

## 2015-02-01 DIAGNOSIS — Z7982 Long term (current) use of aspirin: Secondary | ICD-10-CM | POA: Diagnosis not present

## 2015-02-01 DIAGNOSIS — C50411 Malignant neoplasm of upper-outer quadrant of right female breast: Secondary | ICD-10-CM | POA: Diagnosis not present

## 2015-02-01 DIAGNOSIS — K5190 Ulcerative colitis, unspecified, without complications: Secondary | ICD-10-CM | POA: Diagnosis not present

## 2015-02-01 DIAGNOSIS — M1990 Unspecified osteoarthritis, unspecified site: Secondary | ICD-10-CM | POA: Diagnosis not present

## 2015-02-01 DIAGNOSIS — K219 Gastro-esophageal reflux disease without esophagitis: Secondary | ICD-10-CM | POA: Diagnosis not present

## 2015-02-01 DIAGNOSIS — I10 Essential (primary) hypertension: Secondary | ICD-10-CM | POA: Diagnosis not present

## 2015-02-01 DIAGNOSIS — Z0000 Encounter for general adult medical examination without abnormal findings: Secondary | ICD-10-CM | POA: Diagnosis not present

## 2015-02-01 DIAGNOSIS — Z90710 Acquired absence of both cervix and uterus: Secondary | ICD-10-CM | POA: Diagnosis not present

## 2015-02-01 DIAGNOSIS — J45909 Unspecified asthma, uncomplicated: Secondary | ICD-10-CM | POA: Diagnosis not present

## 2015-02-01 DIAGNOSIS — Z170 Estrogen receptor positive status [ER+]: Secondary | ICD-10-CM | POA: Diagnosis not present

## 2015-02-01 DIAGNOSIS — C50911 Malignant neoplasm of unspecified site of right female breast: Secondary | ICD-10-CM | POA: Diagnosis not present

## 2015-02-01 DIAGNOSIS — Z803 Family history of malignant neoplasm of breast: Secondary | ICD-10-CM | POA: Diagnosis not present

## 2015-02-01 DIAGNOSIS — E780 Pure hypercholesterolemia: Secondary | ICD-10-CM | POA: Diagnosis not present

## 2015-02-01 DIAGNOSIS — E039 Hypothyroidism, unspecified: Secondary | ICD-10-CM | POA: Diagnosis not present

## 2015-02-01 NOTE — Telephone Encounter (Signed)
returned call adn s.w. pt and confirm appt....pt ok and aware

## 2015-02-05 ENCOUNTER — Ambulatory Visit (HOSPITAL_BASED_OUTPATIENT_CLINIC_OR_DEPARTMENT_OTHER): Payer: Medicare Other | Admitting: Anesthesiology

## 2015-02-05 ENCOUNTER — Encounter (HOSPITAL_BASED_OUTPATIENT_CLINIC_OR_DEPARTMENT_OTHER)
Admission: RE | Disposition: A | Discharge status: Home / Self Care | Payer: Self-pay | Source: Ambulatory Visit | Attending: General Surgery

## 2015-02-05 ENCOUNTER — Ambulatory Visit (HOSPITAL_COMMUNITY)
Admission: RE | Admit: 2015-02-05 | Discharge: 2015-02-05 | Disposition: A | Discharge status: Home / Self Care | Payer: Medicare Other | Source: Ambulatory Visit | Attending: General Surgery | Admitting: General Surgery

## 2015-02-05 ENCOUNTER — Ambulatory Visit (HOSPITAL_BASED_OUTPATIENT_CLINIC_OR_DEPARTMENT_OTHER)
Admission: RE | Admit: 2015-02-05 | Discharge: 2015-02-05 | Disposition: A | Discharge status: Home / Self Care | Payer: Medicare Other | Source: Ambulatory Visit | Attending: General Surgery | Admitting: General Surgery

## 2015-02-05 ENCOUNTER — Encounter (HOSPITAL_BASED_OUTPATIENT_CLINIC_OR_DEPARTMENT_OTHER): Payer: Self-pay | Admitting: Anesthesiology

## 2015-02-05 DIAGNOSIS — Z170 Estrogen receptor positive status [ER+]: Secondary | ICD-10-CM | POA: Insufficient documentation

## 2015-02-05 DIAGNOSIS — K5190 Ulcerative colitis, unspecified, without complications: Secondary | ICD-10-CM | POA: Insufficient documentation

## 2015-02-05 DIAGNOSIS — Z0000 Encounter for general adult medical examination without abnormal findings: Secondary | ICD-10-CM | POA: Diagnosis not present

## 2015-02-05 DIAGNOSIS — Z803 Family history of malignant neoplasm of breast: Secondary | ICD-10-CM | POA: Insufficient documentation

## 2015-02-05 DIAGNOSIS — K219 Gastro-esophageal reflux disease without esophagitis: Secondary | ICD-10-CM | POA: Diagnosis not present

## 2015-02-05 DIAGNOSIS — C50911 Malignant neoplasm of unspecified site of right female breast: Secondary | ICD-10-CM | POA: Diagnosis not present

## 2015-02-05 DIAGNOSIS — C50411 Malignant neoplasm of upper-outer quadrant of right female breast: Secondary | ICD-10-CM | POA: Insufficient documentation

## 2015-02-05 DIAGNOSIS — Z7982 Long term (current) use of aspirin: Secondary | ICD-10-CM | POA: Insufficient documentation

## 2015-02-05 DIAGNOSIS — J45909 Unspecified asthma, uncomplicated: Secondary | ICD-10-CM | POA: Diagnosis not present

## 2015-02-05 DIAGNOSIS — I10 Essential (primary) hypertension: Secondary | ICD-10-CM | POA: Insufficient documentation

## 2015-02-05 DIAGNOSIS — M1990 Unspecified osteoarthritis, unspecified site: Secondary | ICD-10-CM | POA: Insufficient documentation

## 2015-02-05 DIAGNOSIS — E039 Hypothyroidism, unspecified: Secondary | ICD-10-CM | POA: Insufficient documentation

## 2015-02-05 DIAGNOSIS — Z90710 Acquired absence of both cervix and uterus: Secondary | ICD-10-CM | POA: Insufficient documentation

## 2015-02-05 DIAGNOSIS — R079 Chest pain, unspecified: Secondary | ICD-10-CM | POA: Diagnosis not present

## 2015-02-05 DIAGNOSIS — G8918 Other acute postprocedural pain: Secondary | ICD-10-CM | POA: Diagnosis not present

## 2015-02-05 DIAGNOSIS — E780 Pure hypercholesterolemia: Secondary | ICD-10-CM | POA: Insufficient documentation

## 2015-02-05 HISTORY — PX: RADIOACTIVE SEED GUIDED PARTIAL MASTECTOMY WITH AXILLARY SENTINEL LYMPH NODE BIOPSY: SHX6520

## 2015-02-05 HISTORY — DX: Hypothyroidism, unspecified: E03.9

## 2015-02-05 HISTORY — DX: Nausea with vomiting, unspecified: R11.2

## 2015-02-05 HISTORY — DX: Gastro-esophageal reflux disease without esophagitis: K21.9

## 2015-02-05 HISTORY — DX: Other specified postprocedural states: Z98.890

## 2015-02-05 SURGERY — RADIOACTIVE SEED GUIDED PARTIAL MASTECTOMY WITH AXILLARY SENTINEL LYMPH NODE BIOPSY
Anesthesia: General | Site: Breast | Laterality: Right

## 2015-02-05 MED ORDER — LIDOCAINE HCL (CARDIAC) 20 MG/ML IV SOLN
INTRAVENOUS | Status: DC | PRN
  Administered 2015-02-05: 50 mg via INTRAVENOUS

## 2015-02-05 MED ORDER — CEFAZOLIN SODIUM-DEXTROSE 2-3 GM-% IV SOLR
2.0000 g | INTRAVENOUS | Status: AC
  Administered 2015-02-05: 2 g via INTRAVENOUS

## 2015-02-05 MED ORDER — OXYCODONE-ACETAMINOPHEN 5-325 MG PO TABS: 1.0000 | ORAL_TABLET | ORAL | Status: DC | PRN

## 2015-02-05 MED ORDER — BUPIVACAINE HCL (PF) 0.25 % IJ SOLN
INTRAMUSCULAR | Status: AC
  Filled 2015-02-05: qty 30

## 2015-02-05 MED ORDER — BUPIVACAINE-EPINEPHRINE (PF) 0.5% -1:200000 IJ SOLN
INTRAMUSCULAR | Status: DC | PRN
  Administered 2015-02-05: 30 mL via PERINEURAL

## 2015-02-05 MED ORDER — BUPIVACAINE-EPINEPHRINE 0.25% -1:200000 IJ SOLN
INTRAMUSCULAR | Status: DC | PRN
  Administered 2015-02-05: 18 mL

## 2015-02-05 MED ORDER — ONDANSETRON HCL 4 MG/2ML IJ SOLN
INTRAMUSCULAR | Status: AC
  Filled 2015-02-05: qty 2

## 2015-02-05 MED ORDER — SCOPOLAMINE 1 MG/3DAYS TD PT72
1.0000 | MEDICATED_PATCH | Freq: Once | TRANSDERMAL | Status: DC | PRN
  Administered 2015-02-05: 1.5 mg via TRANSDERMAL

## 2015-02-05 MED ORDER — PROPOFOL 10 MG/ML IV BOLUS
INTRAVENOUS | Status: DC | PRN
  Administered 2015-02-05: 200 mg via INTRAVENOUS

## 2015-02-05 MED ORDER — SCOPOLAMINE 1 MG/3DAYS TD PT72
MEDICATED_PATCH | TRANSDERMAL | Status: AC
  Filled 2015-02-05: qty 1

## 2015-02-05 MED ORDER — CHLORHEXIDINE GLUCONATE 4 % EX LIQD: 1.0000 "application " | Freq: Once | CUTANEOUS | Status: DC

## 2015-02-05 MED ORDER — FENTANYL CITRATE (PF) 100 MCG/2ML IJ SOLN: 25.0000 ug | INTRAMUSCULAR | Status: DC | PRN

## 2015-02-05 MED ORDER — PROPOFOL 10 MG/ML IV BOLUS
INTRAVENOUS | Status: AC
  Filled 2015-02-05: qty 20

## 2015-02-05 MED ORDER — GLYCOPYRROLATE 0.2 MG/ML IJ SOLN: 0.2000 mg | Freq: Once | INTRAMUSCULAR | Status: DC | PRN

## 2015-02-05 MED ORDER — METHYLENE BLUE 1 % INJ SOLN
INTRAMUSCULAR | Status: AC
  Filled 2015-02-05: qty 10

## 2015-02-05 MED ORDER — BUPIVACAINE-EPINEPHRINE (PF) 0.25% -1:200000 IJ SOLN
INTRAMUSCULAR | Status: AC
  Filled 2015-02-05: qty 30

## 2015-02-05 MED ORDER — PHENYLEPHRINE HCL 10 MG/ML IJ SOLN
INTRAMUSCULAR | Status: DC | PRN
  Administered 2015-02-05 (×4): 40 ug via INTRAVENOUS

## 2015-02-05 MED ORDER — MIDAZOLAM HCL 2 MG/2ML IJ SOLN
INTRAMUSCULAR | Status: AC
  Filled 2015-02-05: qty 4

## 2015-02-05 MED ORDER — ONDANSETRON HCL 4 MG/2ML IJ SOLN: 4.0000 mg | Freq: Once | INTRAMUSCULAR | Status: DC | PRN

## 2015-02-05 MED ORDER — MIDAZOLAM HCL 2 MG/2ML IJ SOLN
INTRAMUSCULAR | Status: AC
  Filled 2015-02-05: qty 2

## 2015-02-05 MED ORDER — DEXAMETHASONE SODIUM PHOSPHATE 4 MG/ML IJ SOLN
INTRAMUSCULAR | Status: DC | PRN
  Administered 2015-02-05: 10 mg via INTRAVENOUS

## 2015-02-05 MED ORDER — ONDANSETRON HCL 4 MG/2ML IJ SOLN
INTRAMUSCULAR | Status: DC | PRN
  Administered 2015-02-05: 4 mg via INTRAVENOUS

## 2015-02-05 MED ORDER — FENTANYL CITRATE (PF) 100 MCG/2ML IJ SOLN
50.0000 ug | INTRAMUSCULAR | Status: DC | PRN
  Administered 2015-02-05: 50 ug via INTRAVENOUS

## 2015-02-05 MED ORDER — LACTATED RINGERS IV SOLN
INTRAVENOUS | Status: DC
  Administered 2015-02-05 (×2): via INTRAVENOUS

## 2015-02-05 MED ORDER — MIDAZOLAM HCL 2 MG/2ML IJ SOLN
1.0000 mg | INTRAMUSCULAR | Status: DC | PRN
Start: 2015-02-05 — End: 2015-02-05
  Administered 2015-02-05: 1 mg via INTRAVENOUS

## 2015-02-05 MED ORDER — LIDOCAINE HCL (CARDIAC) 20 MG/ML IV SOLN
INTRAVENOUS | Status: AC
  Filled 2015-02-05: qty 5

## 2015-02-05 MED ORDER — FENTANYL CITRATE (PF) 100 MCG/2ML IJ SOLN
INTRAMUSCULAR | Status: DC | PRN
  Administered 2015-02-05: 100 ug via INTRAVENOUS

## 2015-02-05 MED ORDER — MIDAZOLAM HCL 5 MG/5ML IJ SOLN
INTRAMUSCULAR | Status: DC | PRN
  Administered 2015-02-05: 2 mg via INTRAVENOUS

## 2015-02-05 MED ORDER — FENTANYL CITRATE (PF) 100 MCG/2ML IJ SOLN
INTRAMUSCULAR | Status: AC
  Filled 2015-02-05: qty 2

## 2015-02-05 MED ORDER — CEFAZOLIN SODIUM-DEXTROSE 2-3 GM-% IV SOLR
INTRAVENOUS | Status: AC
  Filled 2015-02-05: qty 50

## 2015-02-05 MED ORDER — TECHNETIUM TC 99M SULFUR COLLOID FILTERED
1.0000 | Freq: Once | INTRAVENOUS | Status: AC | PRN
  Administered 2015-02-05: 1 via INTRADERMAL

## 2015-02-05 MED ORDER — FENTANYL CITRATE (PF) 100 MCG/2ML IJ SOLN
INTRAMUSCULAR | Status: AC
  Filled 2015-02-05: qty 4

## 2015-02-05 MED ORDER — SODIUM CHLORIDE 0.9 % IJ SOLN
INTRAMUSCULAR | Status: AC
  Filled 2015-02-05: qty 10

## 2015-02-05 SURGICAL SUPPLY — 44 items
APPLIER CLIP 9.375 MED OPEN (MISCELLANEOUS) ×3
APR CLP MED 9.3 20 MLT OPN (MISCELLANEOUS) ×1
BLADE SURG 15 STRL LF DISP TIS (BLADE) ×1 IMPLANT
BLADE SURG 15 STRL SS (BLADE) ×3
CANISTER SUC SOCK COL 7IN (MISCELLANEOUS) IMPLANT
CANISTER SUCT 1200ML W/VALVE (MISCELLANEOUS) IMPLANT
CHLORAPREP W/TINT 26ML (MISCELLANEOUS) ×3 IMPLANT
CLIP APPLIE 9.375 MED OPEN (MISCELLANEOUS) ×1 IMPLANT
COVER BACK TABLE 60X90IN (DRAPES) ×3 IMPLANT
COVER MAYO STAND STRL (DRAPES) ×3 IMPLANT
COVER PROBE W GEL 5X96 (DRAPES) ×3 IMPLANT
DECANTER SPIKE VIAL GLASS SM (MISCELLANEOUS) IMPLANT
DEVICE DUBIN W/COMP PLATE 8390 (MISCELLANEOUS) ×3 IMPLANT
DRAPE LAPAROSCOPIC ABDOMINAL (DRAPES) ×3 IMPLANT
DRAPE UTILITY XL STRL (DRAPES) ×3 IMPLANT
ELECT COATED BLADE 2.86 ST (ELECTRODE) ×3 IMPLANT
ELECT REM PT RETURN 9FT ADLT (ELECTROSURGICAL) ×3
ELECTRODE REM PT RTRN 9FT ADLT (ELECTROSURGICAL) ×1 IMPLANT
GLOVE BIO SURGEON STRL SZ7 (GLOVE) ×2 IMPLANT
GLOVE BIO SURGEON STRL SZ7.5 (GLOVE) ×3 IMPLANT
GLOVE EPREMIER NITRL EXT CFF L (GLOVE) IMPLANT
GLOVE EXAM NITRILE EXT CFF LRG (GLOVE) ×3 IMPLANT
GOWN STRL REUS W/ TWL LRG LVL3 (GOWN DISPOSABLE) ×2 IMPLANT
GOWN STRL REUS W/TWL LRG LVL3 (GOWN DISPOSABLE) ×6
KIT MARKER MARGIN INK (KITS) ×3 IMPLANT
LIQUID BAND (GAUZE/BANDAGES/DRESSINGS) ×3 IMPLANT
NDL HYPO 25X1 1.5 SAFETY (NEEDLE) ×1 IMPLANT
NDL SAFETY ECLIPSE 18X1.5 (NEEDLE) IMPLANT
NEEDLE HYPO 18GX1.5 SHARP (NEEDLE)
NEEDLE HYPO 25X1 1.5 SAFETY (NEEDLE) ×3 IMPLANT
NS IRRIG 1000ML POUR BTL (IV SOLUTION) IMPLANT
PACK BASIN DAY SURGERY FS (CUSTOM PROCEDURE TRAY) ×3 IMPLANT
PENCIL BUTTON HOLSTER BLD 10FT (ELECTRODE) ×3 IMPLANT
SLEEVE SCD COMPRESS KNEE MED (MISCELLANEOUS) ×3 IMPLANT
SPONGE LAP 18X18 X RAY DECT (DISPOSABLE) ×3 IMPLANT
SUT MON AB 4-0 PC3 18 (SUTURE) ×6 IMPLANT
SUT SILK 2 0 SH (SUTURE) IMPLANT
SUT VICRYL 3-0 CR8 SH (SUTURE) ×3 IMPLANT
SYR CONTROL 10ML LL (SYRINGE) ×3 IMPLANT
TOWEL OR 17X24 6PK STRL BLUE (TOWEL DISPOSABLE) ×3 IMPLANT
TOWEL OR NON WOVEN STRL DISP B (DISPOSABLE) ×3 IMPLANT
TUBE CONNECTING 20'X1/4 (TUBING)
TUBE CONNECTING 20X1/4 (TUBING) IMPLANT
YANKAUER SUCT BULB TIP NO VENT (SUCTIONS) IMPLANT

## 2015-02-05 NOTE — Transfer of Care (Signed)
Immediate Anesthesia Transfer of Care Note  Patient: [name]  Procedure(s) Performed: Procedure(s): RADIOACTIVE SEED GUIDED PARTIAL MASTECTOMY WITH AXILLARY SENTINEL LYMPH NODE BIOPSY (Right)  Patient Location: PACU  Anesthesia Type:GA combined with regional for post-op pain  Level of Consciousness: awake and patient cooperative  Airway & Oxygen Therapy: Patient Spontanous Breathing and Patient connected to face mask oxygen  Post-op Assessment: Report given to RN and Post -op Vital signs reviewed and stable  Post vital signs: Reviewed and stable  Last Vitals:  Filed Vitals:   02/05/15 0950  BP:   Pulse: 84  Temp:   Resp: 18    Complications: No apparent anesthesia complications

## 2015-02-05 NOTE — Anesthesia Postprocedure Evaluation (Signed)
  Anesthesia Post-op Note  Patient: [name]  Procedure(s) Performed: Procedure(s) (LRB): RADIOACTIVE SEED GUIDED PARTIAL MASTECTOMY WITH AXILLARY SENTINEL LYMPH NODE BIOPSY (Right)  Patient Location: PACU  Anesthesia Type: GA combined with regional for post-op pain  Level of Consciousness: awake and alert   Airway and Oxygen Therapy: Patient Spontanous Breathing  Post-op Pain: mild  Post-op Assessment: Post-op Vital signs reviewed, Patient's Cardiovascular Status Stable, Respiratory Function Stable, Patent Airway and No signs of Nausea or vomiting  Last Vitals:  Filed Vitals:   02/05/15 1247  BP: 130/68  Pulse: 73  Temp: 36.7 C  Resp: 16    Post-op Vital Signs: stable   Complications: No apparent anesthesia complications

## 2015-02-05 NOTE — Interval H&P Note (Signed)
History and Physical Interval Note:  02/05/2015 10:13 AM  [name]  has presented today for surgery, with the diagnosis of RIGHT BREAST CANCER  The various methods of treatment have been discussed with the patient and family. After consideration of risks, benefits and other options for treatment, the patient has consented to  Procedure(s): RADIOACTIVE SEED GUIDED PARTIAL MASTECTOMY WITH AXILLARY SENTINEL LYMPH NODE BIOPSY (Right) as a surgical intervention .  The patient's history has been reviewed, patient examined, no change in status, stable for surgery.  I have reviewed the patient's chart and labs.  Questions were answered to the patient's satisfaction.     TOTH III,PAUL S

## 2015-02-05 NOTE — H&P (Signed)
[name] 01/24/2015 8:06 AM Location: [facility] Patient #: [id number] DOB: 06/16/1945 Undefined / Language: [name] / Race: Black or African American Female  History of Present Illness Sammuel Hines. [name] MD; 01/24/2015 3:37 PM) The patient is a 69 year old female who presents with breast cancer. We are asked to see the patient in consultation by Dr. Marcelo Baldy to evaluate her for a right breast cancer. The patient is a 69 year old black female who recently went for a routine screening mammogram. At that time she was found to have an abnormality in the 12:00 position of the right breast. This measured 1.6 cm by ultrasound. It was biopsied and came back as an invasive breast cancer. She was ER and PR positive and HER-2 negative with a Ki-67 of 30%. She denied any breast pain or discharge from the nipple. She does not take any hormone replacement. She does have a family history of breast cancer in a first cousin at the age of 22 years old.   Other Problems Conni Slipper, RN; 01/24/2015 8:07 AM) Arthritis Asthma Back Pain Breast Cancer Diverticulosis Gastroesophageal Reflux Disease General anesthesia - complications Hemorrhoids High blood pressure Hypercholesterolemia Thyroid Disease Ulcerative Colitis  Past Surgical History Conni Slipper, RN; 01/24/2015 8:07 AM) Breast Biopsy Right. Foot Surgery Left. Hysterectomy (not due to cancer) - Partial Oral Surgery  Diagnostic Studies History Conni Slipper, RN; 01/24/2015 8:07 AM) Colonoscopy 1-5 years ago Mammogram within last year Pap Smear 1-5 years ago  Medication History Conni Slipper, RN; 01/24/2015 8:07 AM) No Current Medications Medications Reconciled  Social History Conni Slipper, RN; 01/24/2015 8:07 AM) Alcohol use Occasional alcohol use. Caffeine use Carbonated beverages, Coffee, Tea. No drug use Tobacco use Never smoker.  Family History Conni Slipper, RN; 01/24/2015 8:07 AM) Anesthetic  complications Daughter. Arthritis Father. Bleeding disorder Family Members In General. Cancer Mother. Colon Cancer Father. Colon Polyps Brother, Sister. Heart Disease Father. Hypertension Brother, Daughter, Father, Sister. Ischemic Bowel Disease Brother, Sister. Migraine Headache Daughter. Thyroid problems Daughter, Sister.  Pregnancy / Birth History Conni Slipper, RN; 01/24/2015 8:07 AM) Age at menarche 70 years. Age of menopause <45 Contraceptive History Oral contraceptives. Gravida 2 Irregular periods Maternal age 51-20 Para 2  Review of Systems Conni Slipper RN; 01/24/2015 8:07 AM) General Not Present- Appetite Loss, Chills, Fatigue, Fever, Night Sweats, Weight Gain and Weight Loss. Skin Present- Change in Wart/Mole and Dryness. Not Present- Hives, Jaundice, New Lesions, Non-Healing Wounds, Rash and Ulcer. HEENT Present- Seasonal Allergies and Wears glasses/contact lenses. Not Present- Earache, Hearing Loss, Hoarseness, Nose Bleed, Oral Ulcers, Ringing in the Ears, Sinus Pain, Sore Throat, Visual Disturbances and Yellow Eyes. Respiratory Not Present- Bloody sputum, Chronic Cough, Difficulty Breathing, Snoring and Wheezing. Breast Not Present- Breast Mass, Breast Pain, Nipple Discharge and Skin Changes. Cardiovascular Present- Swelling of Extremities. Not Present- Chest Pain, Difficulty Breathing Lying Down, Leg Cramps, Palpitations, Rapid Heart Rate and Shortness of Breath. Gastrointestinal Present- Chronic diarrhea and Constipation. Not Present- Abdominal Pain, Bloating, Bloody Stool, Change in Bowel Habits, Difficulty Swallowing, Excessive gas, Gets full quickly at meals, Hemorrhoids, Indigestion, Nausea, Rectal Pain and Vomiting. Female Genitourinary Not Present- Frequency, Nocturia, Painful Urination, Pelvic Pain and Urgency. Musculoskeletal Present- Back Pain, Joint Pain and Swelling of Extremities. Not Present- Joint Stiffness, Muscle Pain and Muscle  Weakness. Neurological Present- Trouble walking. Not Present- Decreased Memory, Fainting, Headaches, Numbness, Seizures, Tingling, Tremor and Weakness. Psychiatric Not Present- Anxiety, Bipolar, Change in Sleep Pattern, Depression, Fearful and Frequent crying. Endocrine Present- Hot flashes. Not Present- Cold  Intolerance, Excessive Hunger, Hair Changes, Heat Intolerance and New Diabetes. Hematology Present- Gland problems. Not Present- Easy Bruising, Excessive bleeding, HIV and Persistent Infections.   Physical Exam Eddie Dibbles S. [name] MD; 01/24/2015 3:38 PM) General Mental Status-Alert. General Appearance-Consistent with stated age. Hydration-Well hydrated. Voice-Normal.  Head and Neck Head-normocephalic, atraumatic with no lesions or palpable masses. Trachea-midline. Thyroid Gland Characteristics - normal size and consistency.  Eye Eyeball - Bilateral-Extraocular movements intact. Sclera/Conjunctiva - Bilateral-No scleral icterus.  Chest and Lung Exam Chest and lung exam reveals -quiet, even and easy respiratory effort with no use of accessory muscles and on auscultation, normal breath sounds, no adventitious sounds and normal vocal resonance. Inspection Chest Wall - Normal. Back - normal.  Breast Note: There was a palpable bruise in the 12:00 position of the right breast. Other than this there is no palpable mass in either breast. There is no palpable axillary, supraclavicular, or cervical lymphadenopathy.   Cardiovascular Cardiovascular examination reveals -normal heart sounds, regular rate and rhythm with no murmurs and normal pedal pulses bilaterally.  Abdomen Inspection Inspection of the abdomen reveals - No Hernias. Skin - Scar - no surgical scars. Palpation/Percussion Palpation and Percussion of the abdomen reveal - Soft, Non Tender, No Rebound tenderness, No Rigidity (guarding) and No hepatosplenomegaly. Auscultation Auscultation of the abdomen  reveals - Bowel sounds normal.  Neurologic Neurologic evaluation reveals -alert and oriented x 3 with no impairment of recent or remote memory. Mental Status-Normal.  Musculoskeletal Normal Exam - Left-Upper Extremity Strength Normal and Lower Extremity Strength Normal. Normal Exam - Right-Upper Extremity Strength Normal and Lower Extremity Strength Normal.  Lymphatic Head & Neck  General Head & Neck Lymphatics: Bilateral - Description - Normal. Axillary  General Axillary Region: Bilateral - Description - Normal. Tenderness - Non Tender. Femoral & Inguinal  Generalized Femoral & Inguinal Lymphatics: Bilateral - Description - Normal. Tenderness - Non Tender.    Assessment & Plan Eddie Dibbles S. [name] MD; 01/24/2015 3:39 PM) PRIMARY CANCER OF UPPER OUTER QUADRANT OF RIGHT FEMALE BREAST (174.4  C50.411) Impression: The patient appears to have a stage I cancer in the 12:00 position of the right breast. I have talked her in detail about the different options for surgical treatment and at this point she favors breast conservation. I think this is a very reasonable way to get rid of her breast cancer. She is also a good candidate for sentinel node mapping. I have discussed with her in detail the risks and benefits of the operation to do this as well as some of the technical aspects and she understands and wishes to proceed. I will plan for a right breast radioactive seed localized lumpectomy and sentinel node mapping Current Plans  Pt Education - Breast Cancer: discussed with patient and provided information.   Signed by Luella Cook, MD (01/24/2015 3:40 PM)

## 2015-02-05 NOTE — Anesthesia Procedure Notes (Addendum)
Anesthesia Regional Block:  Pectoralis block  Pre-Anesthetic Checklist: ,, timeout performed, Correct Patient, Correct Site, Correct Laterality, Correct Procedure, Correct Position, site marked, Risks and benefits discussed,  Surgical consent,  Pre-op evaluation,  At surgeon's request and post-op pain management  Laterality: Right  Prep: chloraprep       Needles:  Injection technique: Single-shot  Needle Type: Echogenic Needle     Needle Length: 9cm 9 cm Needle Gauge: 22 and 22 G    Additional Needles:  Procedures: ultrasound guided (picture in chart) Pectoralis block Narrative:  Start time: 02/05/2015 9:42 AM End time: 02/05/2015 9:46 AM Injection made incrementally with aspirations every 5 mL.  Performed by: Personally  Anesthesiologist: Catalina Gravel  Additional Notes: Patient positioned in left lateral decubitus position.  Area prepped with chlorhexidine.  Local anesthetic infiltrated at skin.  Good visualization of ribs using ultrasound.  Needle advanced under [place] visualization. Negative aspiration. Good spread of local anesthetic.  Total of 30cc of 0.5% Bupivacaine with epi deposited. Patient tolerated procedure well.  Collins Scotland, MD   Procedure Name: LMA Insertion Date/Time: 02/05/2015 10:35 AM Performed by: Marrianne Mood Pre-anesthesia Checklist: Patient identified, Emergency Drugs available, Suction available, Patient being monitored and Timeout performed Patient Re-evaluated:Patient Re-evaluated prior to inductionOxygen Delivery Method: Circle System Utilized Preoxygenation: Pre-oxygenation with 100% oxygen Intubation Type: IV induction Ventilation: Mask ventilation without difficulty LMA: LMA inserted LMA Size: 4.0 Number of attempts: 1 Airway Equipment and Method: Bite block Placement Confirmation: positive ETCO2 Tube secured with: Tape Dental Injury: Teeth and Oropharynx as per pre-operative assessment

## 2015-02-05 NOTE — Discharge Instructions (Signed)

## 2015-02-05 NOTE — Progress Notes (Signed)
Assisted Dr. Gifford Shave with right, ultrasound guided, pectoralis block. Side rails up, monitors on throughout procedure. See vital signs in flow sheet. Tolerated Procedure well.

## 2015-02-05 NOTE — Op Note (Signed)
02/05/2015  11:48 AM  PATIENT:  [name]  69 y.o. female  PRE-OPERATIVE DIAGNOSIS:  RIGHT BREAST CANCER  POST-OPERATIVE DIAGNOSIS:  RIGHT BREAST CANCER  PROCEDURE:  Procedure(s): RADIOACTIVE SEED GUIDED PARTIAL MASTECTOMY WITH AXILLARY SENTINEL LYMPH NODE BIOPSY (Right)  SURGEON:  Surgeon(s) and Role:    * Jovita Kussmaul, MD - Primary  PHYSICIAN ASSISTANT:   ASSISTANTS: none   ANESTHESIA:   general  EBL:  Total I/O In: 1000 [I.V.:1000] Out: -   BLOOD ADMINISTERED:none  DRAINS: none   LOCAL MEDICATIONS USED:  MARCAINE     SPECIMEN:  Source of Specimen:  right breast tissue and axillary sentinel node  DISPOSITION OF SPECIMEN:  PATHOLOGY  COUNTS:  YES  TOURNIQUET:  * No tourniquets in log *  DICTATION: .Dragon Dictation   After informed consent was obtained the patient was brought to the operating room and placed in the supine position on the operating room table. After adequate induction of general anesthesia the patient's right chest, breast, and axillary area were prepped with ChloraPrep, allowed to dry, and draped in usual sterile manner. Earlier in the day the patient underwent injection of 1 mCi of technetium sulfur colloid in the subareolar position on the right. Previously an I-125 seed was placed in the upper outer portion of the right breast to mark an area of invasive breast cancer. Attention was first turned to the right axilla. The neoprobe was set to technetium. A hot spot was identified in the right axilla. A small transversely oriented incision was made with a 15 blade knife overlying the hot spot. The incision was carried through the skin and subcutaneous tissue sharply with the electrocautery until the axilla was entered. A [place] retractor was deployed. Using the neoprobe I was able to identify a right axillary lymph node with increased activity. This lymph node was excised by combination of sharp and Bovie dissection and the lymphatics were  controlled with clips. Ex vivo counts on this node were approximately 100. There were no other hot or palpable lymph nodes in the right axilla. This lymph node was sent as sentinel node #1. There was some extra axillary tissue that was removed along with this lymph node and this was sent as right axillary tissue. Hemostasis was achieved using the Bovie electrocautery. The wound was irrigated with saline and infiltrated with quarter percent Marcaine. The deep layer of the wound was then closed with interrupted 3-0 Vicryl stitches. The skin was then closed with a running 4-0 Monocryl subcuticular stitch. Attention was then turned to the right breast. The neoprobe was set to I-125. The area of radioactivity in the upper outer quadrant was readily identified. An elliptical incision was made in the skin overlying the area of radioactivity with a 15 blade knife. The incision was carried through the skin and subcutaneous tissue sharply with the electrocautery. While checking the area of radioactivity frequently a circular portion of breast tissue was excised sharply with the electrocautery around the radioactive seed. This dissection was carried all the way to the chest wall. Once the specimen was removed it was oriented with the appropriate paint colors. A specimen radiograph was obtained that showed the clip and seed to be in the center of the specimen. There was no residual I-125 radioactivity in the breast. The specimen was then sent to pathology for further evaluation. The edges of the lumpectomy cavity were marked with clips. The wound was irrigated with saline and infiltrated with Marcaine. The breast tissue was mobilized  away from the chest wall circumferentially around the incision so that the breast tissue could be closed. The deep layer of the wound was then closed with layers of interrupted 3-0 Vicryl stitches. The skin was then closed with interrupted 4-0 Monocryl subcuticular stitches. Dermabond dressings  were then applied. The patient tolerated the procedure well. At the end of the case all needle sponge and instrument counts were correct. The patient was then awakened and taken to recovery in stable condition.  PLAN OF CARE: Discharge to home after PACU  PATIENT DISPOSITION:  PACU - hemodynamically stable.   Delay start of Pharmacological VTE agent (>24hrs) due to surgical blood loss or risk of bleeding: not applicable

## 2015-02-05 NOTE — Anesthesia Preprocedure Evaluation (Signed)
Anesthesia Evaluation  Patient identified by MRN, date of birth, ID band Patient awake    Reviewed: Allergy & Precautions, NPO status , Patient's Chart, lab work & pertinent test results  History of Anesthesia Complications (+) PONV and history of anesthetic complications  Airway Mallampati: II  TM Distance: >3 FB Neck ROM: Full    Dental  (+) Dental Advisory Given, Implants   Pulmonary asthma (rare inhaler use) ,    Pulmonary exam normal breath sounds clear to auscultation       Cardiovascular Exercise Tolerance: Good hypertension, Pt. on medications (-) angina(-) Past MI Normal cardiovascular exam Rhythm:Regular Rate:Normal     Neuro/Psych PSYCHIATRIC DISORDERS Anxiety negative neurological ROS     GI/Hepatic Neg liver ROS, GERD  Medicated and Controlled,  Endo/Other  Hypothyroidism   Renal/GU negative Renal ROS     Musculoskeletal  (+) Arthritis , Osteoarthritis,    Abdominal   Peds  Hematology negative hematology ROS (+)   Anesthesia Other Findings Day of surgery medications reviewed with the patient.  Reproductive/Obstetrics                             Anesthesia Physical Anesthesia Plan  ASA: II  Anesthesia Plan: General   Post-op Pain Management: GA combined w/ Regional for post-op pain   Induction: Intravenous  Airway Management Planned: LMA  Additional Equipment:   Intra-op Plan:   Post-operative Plan: Extubation in OR  Informed Consent: I have reviewed the patients History and Physical, chart, labs and discussed the procedure including the risks, benefits and alternatives for the proposed anesthesia with the patient or authorized representative who has indicated his/her understanding and acceptance.   Dental advisory given  Plan Discussed with: CRNA  Anesthesia Plan Comments: (Risks/benefits of general anesthesia discussed with patient including risk of damage  to teeth, lips, gum, and tongue, nausea/vomiting, allergic reactions to medications, and the possibility of heart attack, stroke and death.  All patient questions answered.  Patient wishes to proceed.  Discussed PECs block with patient including risks/benefits/alternatives.  Patient consents to block.)        Anesthesia Quick Evaluation

## 2015-02-06 ENCOUNTER — Encounter (HOSPITAL_BASED_OUTPATIENT_CLINIC_OR_DEPARTMENT_OTHER): Payer: Self-pay | Admitting: General Surgery

## 2015-02-12 ENCOUNTER — Telehealth: Payer: Self-pay | Admitting: *Deleted

## 2015-02-12 ENCOUNTER — Encounter: Payer: Self-pay | Admitting: Hematology and Oncology

## 2015-02-12 ENCOUNTER — Ambulatory Visit (HOSPITAL_BASED_OUTPATIENT_CLINIC_OR_DEPARTMENT_OTHER): Payer: Medicare Other | Admitting: Hematology and Oncology

## 2015-02-12 VITALS — BP 155/90 | HR 80 | Temp 98.2°F | Resp 18 | Ht 64.0 in | Wt 147.3 lb

## 2015-02-12 DIAGNOSIS — Z170 Estrogen receptor positive status [ER+]: Secondary | ICD-10-CM

## 2015-02-12 DIAGNOSIS — C50411 Malignant neoplasm of upper-outer quadrant of right female breast: Secondary | ICD-10-CM | POA: Diagnosis not present

## 2015-02-12 NOTE — Progress Notes (Signed)
Patient Care Team: Darlyne Russian, MD as PCP - General (Family Medicine) Autumn Messing III, MD as Consulting Physician (General Surgery) Nicholas Lose, MD as Consulting Physician (Hematology and Oncology) Kyung Rudd, MD as Consulting Physician (Radiation Oncology) Mauro Kaufmann, RN as Registered Nurse Rockwell Germany, RN as Registered Nurse Sylvan Cheese, NP as Nurse Practitioner (Nurse Practitioner)  DIAGNOSIS: Breast cancer of upper-outer quadrant of right female breast   Staging form: Breast, AJCC 7th Edition     Clinical stage from 01/24/2015: Stage IA (T1c, N0, M0) - Unsigned     Pathologic stage from 02/07/2015: Stage IA (T1c, N0, cM0) - Unsigned       Staging comments: Staged on final lumpectomy specimen by Dr. 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 She has done fairly well from surgery. She has mild discomfort under the arm. Denies any major pain.  REVIEW OF SYSTEMS:   Constitutional: Denies fevers, chills or abnormal weight loss Eyes: Denies blurriness of vision Ears, nose, mouth, throat, and face: Denies mucositis or sore throat Respiratory: Denies cough, dyspnea or wheezes Cardiovascular: Denies palpitation, chest discomfort or lower extremity swelling Gastrointestinal:   Denies nausea, heartburn or change in bowel habits Skin: Denies abnormal skin rashes Lymphatics: Denies new lymphadenopathy or easy bruising Neurological:Denies numbness, tingling or new weaknesses Behavioral/Psych: Mood is stable, no new changes  Breast: healing and recovering from prior surgery All other systems were reviewed with the patient and are negative.  I have reviewed the past medical history, past surgical history, social history and family history with the patient and they are unchanged from previous note.  ALLERGIES:  is allergic to contrast media and silvadene.  MEDICATIONS:  Current Outpatient Prescriptions  Medication Sig Dispense Refill  . albuterol (PROVENTIL HFA;VENTOLIN HFA) 108 (90 BASE) MCG/ACT inhaler Inhale into the lungs every 6 (six) hours as needed for wheezing or shortness of breath.    Marland Kitchen amLODipine (NORVASC) 10 MG tablet Take 1 tablet (10 mg total) by mouth daily. 90 tablet 3  . aspirin EC 325 MG tablet Take 1 tablet (325 mg total) by mouth daily. 30 tablet 0  . Calcium Carbonate-Vitamin D (CALCIUM + D PO) Take 2 tablets by mouth daily.     . cetirizine (ZYRTEC) 10 MG tablet Take 10 mg by mouth daily as needed for allergies.    . cholecalciferol (VITAMIN D) 1000 UNITS tablet Take 1,000 Units by mouth daily.      Marland Kitchen ezetimibe (ZETIA) 10 MG tablet Take 1 tablet (10 mg total) by mouth daily. 30 tablet 11  . hydrochlorothiazide (HYDRODIURIL) 25 MG tablet Take 1 tablet (25 mg total) by mouth daily. 90 tablet 3  . KLOR-CON 10 10 MEQ tablet Take 2 tablets (20 mEq total) by mouth daily. 180 tablet 3  . mesalamine (LIALDA) 1.2 G EC  tablet Take 4,800 mg by mouth daily.     . montelukast (SINGULAIR) 10 MG tablet Take 1 tablet (10 mg total) by mouth at bedtime. 90 tablet 3  . Multiple Vitamin (MULTIVITAMIN) tablet Take 1 tablet by mouth daily.     Marland Kitchen oxyCODONE-acetaminophen (ROXICET) 5-325 MG per tablet Take 1-2 tablets by mouth every 4 (four) hours as needed. 50 tablet 0   . SYNTHROID 112 MCG tablet Take 1 tablet (112 mcg total) by mouth daily before breakfast. 90 tablet 3   No current facility-administered medications for this visit.    PHYSICAL EXAMINATION: ECOG PERFORMANCE STATUS: 1 - Symptomatic but completely ambulatory  Filed Vitals:   02/12/15 1100  BP: 155/90  Pulse: 80  Temp: 98.2 F (36.8 C)  Resp: 18   Filed Weights   02/12/15 1100  Weight: 147 lb 4.8 oz (66.815 kg)    GENERAL:alert, no distress and comfortable SKIN: skin color, texture, turgor are normal, no rashes or significant lesions EYES: normal, Conjunctiva are pink and non-injected, sclera clear OROPHARYNX:no exudate, no erythema and lips, buccal mucosa, and tongue normal  NECK: supple, thyroid normal size, non-tender, without nodularity LYMPH:  no palpable lymphadenopathy in the cervical, axillary or inguinal LUNGS: clear to auscultation and percussion with normal breathing effort HEART: regular rate & rhythm and no murmurs and no lower extremity edema ABDOMEN:abdomen soft, non-tender and normal bowel sounds Musculoskeletal:no cyanosis of digits and no clubbing  NEURO: alert & oriented x 3 with fluent speech, no focal motor/sensory deficits  LABORATORY DATA:  I have reviewed the data as listed   Chemistry      Component Value Date/Time   NA 141 01/24/2015 1238   NA 137 10/12/2014 1048   K 3.4* 01/24/2015 1238   K 3.4* 10/12/2014 1048   CL 101 10/12/2014 1048   CO2 31* 01/24/2015 1238   CO2 29 10/12/2014 1048   BUN 16.2 01/24/2015 1238   BUN 13 10/12/2014 1048   CREATININE 0.8 01/24/2015 1238   CREATININE 0.49* 10/12/2014 1048   CREATININE 0.54 03/15/2013 0410      Component Value Date/Time   CALCIUM 10.0 01/24/2015 1238   CALCIUM 9.4 10/12/2014 1048   ALKPHOS 85 01/24/2015 1238   ALKPHOS 84 01/27/2014 1023   AST 15 01/24/2015 1238   AST 15 01/27/2014 1023   ALT 12 01/24/2015 1238   ALT 14 01/27/2014 1023   BILITOT 0.43 01/24/2015 1238   BILITOT 0.6  01/27/2014 1023       Lab Results  Component Value Date   WBC 9.5 01/24/2015   HGB 13.0 01/24/2015   HCT 39.5 01/24/2015   MCV 89.0 01/24/2015   PLT 395 01/24/2015   NEUTROABS 6.2 01/24/2015   ASSESSMENT & PLAN:  Breast cancer of upper-outer quadrant of right female breast Right lumpectomy 02/05/2015: Invasive ductal carcinoma 1.7 cm, grade 2, clear margins with DCIS, 0/2 lymph nodes negative,No LVI, ER 100%, PR 100%, HER-2 negative, Ki-67 30%, T1 cN0 stage IA  Pathology review: I discussed the final pathology report of the patient for with a copy of this report. There are no surprises in the final pathology result. The margins are clear. Lymph nodes are negative.  Recommendation: 1. Oncotype DX testing to determine if chemotherapy would be of any benefit followed by 2. Adjuvant radiation therapy followed by 3. Adjuvant antiestrogen therapy  Return to clinic based on Oncotype DX result   No orders of the defined types were placed in this encounter.   The patient  has a good understanding of the overall plan. she agrees with it. she will call with any problems that may develop before the next visit here.   Rulon Eisenmenger, MD

## 2015-02-12 NOTE — Telephone Encounter (Signed)
Received order for oncotype testing per Dr. Lindi Adie. Requisition sent to pathology. Received by Alyse Low.

## 2015-02-12 NOTE — Addendum Note (Signed)
Addended by: Prentiss Bells on: 02/12/2015 02:32 PM   Modules accepted: Medications

## 2015-02-12 NOTE — Assessment & Plan Note (Addendum)
Right lumpectomy 02/05/2015: Invasive ductal carcinoma 1.7 cm, grade 2, clear margins with DCIS, 0/2 lymph nodes negative,No LVI, ER 100%, PR 100%, HER-2 negative, Ki-67 30%, T1 cN0 stage IA  Pathology review: I discussed the final pathology report of the patient for with a copy of this report. There are no surprises in the final pathology result. The margins are clear. Lymph nodes are negative.  Recommendation: 1. Oncotype DX testing to determine if chemotherapy would be of any benefit followed by 2. Adjuvant radiation therapy followed by 3. Adjuvant antiestrogen therapy  Return to clinic based on Oncotype DX result

## 2015-02-14 ENCOUNTER — Telehealth: Payer: Self-pay | Admitting: Genetic Counselor

## 2015-02-14 NOTE — Telephone Encounter (Signed)
Discussed with [name] that her genetic test results were negative for mutations within any of 8 genes that would cause her to be at an increased risk for breast and other related cancers.  Additionally, no variants of uncertain significance were found.  We discussed the while our genetic testing options and knowledge may expand in the future, this result is probably pretty reassuring for [place] because many of [name] family members have never been diagnosed with cancer and have lived to later ages in life.  Thus, this negative result does not change future cancer screening for [name] and her family members, but the women in the family are still considered to be at a somewhat increased risk for breast cancer based on the history.  [name]'s sister should continue to have annual mammograms, as should her oldest daughter.  Her youngest daughter as well as her nieces should begin having annual mammograms at the age of 60.  Self breast exams and annual clinical breast exams are also important means of breast cancer screening.  Relatives who are more closely related to [name] maternal first cousin may begin having mammograms much earlier based on her diagnosis of breast cancer around the age of 72.  This cousin should have genetic counseling and testing if she has not done so yet.  It could be that there is something genetic that is coming from her father's side of the family.  [name] reported that this cousin lives in [place], but that she will discuss this with her.  [name] and her siblings should continue to have colonoscopies every 5 years or more often as indicated by positive findings and based upon [name] father's history of colon cancer.  Her daughters, nieces, and nephews can begin colonoscopies at the age of 36 based on this family history.  [name] is welcome to call me with any further questions.  This test report will be scanned into her chart and her doctors  will be made aware of the result.

## 2015-02-15 ENCOUNTER — Encounter: Payer: Self-pay | Admitting: Emergency Medicine

## 2015-02-15 ENCOUNTER — Ambulatory Visit (INDEPENDENT_AMBULATORY_CARE_PROVIDER_SITE_OTHER): Payer: Medicare Other | Admitting: Emergency Medicine

## 2015-02-15 VITALS — BP 129/78 | HR 85 | Temp 98.1°F | Resp 16 | Ht 64.0 in | Wt 147.8 lb

## 2015-02-15 DIAGNOSIS — I10 Essential (primary) hypertension: Secondary | ICD-10-CM | POA: Diagnosis not present

## 2015-02-15 DIAGNOSIS — E785 Hyperlipidemia, unspecified: Secondary | ICD-10-CM

## 2015-02-15 DIAGNOSIS — Z23 Encounter for immunization: Secondary | ICD-10-CM

## 2015-02-15 LAB — BASIC METABOLIC PANEL WITH GFR
BUN: 18 mg/dL (ref 7–25)
CALCIUM: 9.8 mg/dL (ref 8.6–10.4)
CHLORIDE: 101 mmol/L (ref 98–110)
CO2: 30 mmol/L (ref 20–31)
Creat: 0.57 mg/dL (ref 0.50–0.99)
GFR, Est African American: >89 mL/min (ref >=60)
GLUCOSE: 78 mg/dL (ref 65–99)
POTASSIUM: 3.5 mmol/L (ref 3.5–5.3)
SODIUM: 139 mmol/L (ref 135–146)

## 2015-02-15 LAB — LIPID PANEL
Cholesterol: 174 mg/dL (ref 125–200)
HDL: 36 mg/dL — ABNORMAL LOW (ref >=46)
LDL Cholesterol: 111 mg/dL (ref <130)
Total CHOL/HDL Ratio: 4.8 Ratio (ref <=5.0)
Triglycerides: 137 mg/dL (ref <150)
VLDL: 27 mg/dL (ref <30)
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
## 2015-02-19 ENCOUNTER — Ambulatory Visit: Payer: Self-pay | Admitting: Genetic Counselor

## 2015-02-19 DIAGNOSIS — Z1379 Encounter for other screening for genetic and chromosomal anomalies: Secondary | ICD-10-CM | POA: Insufficient documentation

## 2015-02-19 NOTE — Progress Notes (Signed)
GENETIC TEST RESULTS  HPI: Ms. Mccravy was previously seen in the [facility] clinic due to a personal history of breast cancer, family history of breast and other cancers, and concerns regarding a hereditary predisposition to cancer. Please refer to our prior cancer genetics clinic note from January 25, 2015 for more information regarding Ms. Date's medical, social and family histories, and our assessment and recommendations, at the time. Ms. Matsuoka's recent genetic test results were disclosed to her, as were recommendations warranted by these results. These results and recommendations are discussed in more detail below.  GENETIC TEST RESULTS: At the time of Ms. Vantuyl's visit on 01/25/15, we recommended she pursue genetic testing of the 8-gene BRCAplus-Expanded Panel through [employer] [facility], [place]).  The BRCAplus-Expanded Panel offered by [employer] includes sequencing and deletion/duplication analysis for the following 8 genes: ATM, BRCA1, BRCA2, CDH1, CHEK2, PALB2, PTEN, and TP53.  Those results are now back, the report date for which is February 13, 2015.  Genetic testing was normal, and did not reveal a deleterious mutation in these genes.  Additionally, no variants of uncertain significance (VUSs) were found.  The test report has been scanned into EPIC and will be located under the Results Review tab in the Pathology>Molecular Pathology section.   We discussed with Ms. Klar that since the current genetic testing is not perfect, it is possible there may be a gene mutation in one of these genes that current testing cannot detect, but that chance is small. We also discussed, that it is possible that another gene that has not yet been discovered, or that we have not yet tested, is responsible for the cancer diagnoses in the family, and it is, therefore, important to remain in touch with cancer genetics in the future so that we can continue to offer Ms.  Alridge the most up to date genetic testing.   CANCER SCREENING RECOMMENDATIONS: This result is reassuring and indicates that Ms. Roberge likely does not have an increased risk for a future cancer due to a mutation in one of these genes. This normal test also suggests that Ms. Sheetz's cancer was most likely not due to an inherited predisposition associated with one of these genes.  Most cancers happen by chance and this negative test suggests that her cancer falls into this category.  Additionally, many of Ms. Walts's family members have never been diagnosed with cancer and have lived to later ages in life. We, therefore, recommended she continue to follow the cancer management and screening guidelines provided by her oncology and primary healthcare providers.   RECOMMENDATIONS FOR FAMILY MEMBERS: Women in this family might be at some increased risk of developing cancer, over the general population risk, simply due to the family history of cancer. We recommended women in this family have a yearly mammogram beginning at age 60, or 15 years younger than the earliest onset of cancer, an an annual clinical breast exam, and perform monthly breast self-exams. Ms. Shearman's sister should continue to have annual mammograms, as should her oldest daughter.  Her youngest daughter as well as her nieces should begin having annual mammograms at the age of 31.  Relatives who are more closely related to Ms. Puls's maternal first cousin may begin having mammograms much earlier based on her diagnosis of breast cancer around the age of 25.  This cousin should have genetic counseling and testing if she has not done so yet.  It could be that there is hereditary cancer risk  that is coming from her cousin's father's side of the family.   Women in this family should also have a gynecological exam as recommended by their primary provider.  Ms. Reinecke and her siblings should continue to have colonoscopies every 5 years or more  often as indicated by positive findings and based upon Ms. Tennis's father's history of colon cancer.  Her daughters, nieces, and nephews can begin colonoscopies at the age of 2 based on this family history.  Based on Ms. Casseus's family history, we recommended her maternal first cousin, who was diagnosed with breast cancer at age 19, have genetic counseling and testing. Ms. Sak will let us know if we can be of any assistance in coordinating genetic counseling and/or testing for this family member. Ms. Labuda reported that this cousin lives in [place], but that she will discuss genetic counseling/testing with her.  FOLLOW-UP: Lastly, we discussed with Ms. Alcorta that cancer genetics is a rapidly advancing field and it is possible that new genetic tests will be appropriate for her and/or her family members in the future. We encouraged her to remain in contact with cancer genetics on an annual basis so we can update her personal and family histories and let her know of advances in cancer genetics that may benefit this family.   Our contact number was provided. Ms. Kalb's questions were answered to her satisfaction, and she knows she is welcome to call us at anytime with additional questions or concerns.   Jeanine Luz, MS Genetic Counselor [email or web address] Phone: [phone]

## 2015-02-20 ENCOUNTER — Encounter (HOSPITAL_COMMUNITY): Payer: Self-pay

## 2015-02-20 ENCOUNTER — Encounter: Payer: Self-pay | Admitting: *Deleted

## 2015-02-20 DIAGNOSIS — C50411 Malignant neoplasm of upper-outer quadrant of right female breast: Secondary | ICD-10-CM | POA: Diagnosis not present

## 2015-02-20 NOTE — Progress Notes (Signed)
Received oncotype score of 0/3%.  Patient aware.  Referral placed for Dr. Lisbeth Renshaw. Copy to medical records and copy to Dr. Lindi Adie

## 2015-02-27 ENCOUNTER — Encounter: Payer: Self-pay | Admitting: Emergency Medicine

## 2015-02-27 NOTE — Progress Notes (Signed)
Location of Breast Cancer:Right Breast Upper Outer Quadrant 11:00 position  Histology per Pathology Report: Diagnosis 01/18/15:Breast, right, needle core biopsy - INVASIVE MAMMARY CARCINOMA.- MAMMARY CARCINOMA IN SITU.  Receptor Status: ER(+100%), PR (+100%), Her2-neu ( KI 67-30%)   Did patient present with symptoms (if so, please note symptoms) or was this found on screening mammography?: Found on routine mammogram screening  Past/Anticipated interventions by surgeon, if GML:[id number] 02/05/15:Dr. Eddie Dibbles Toth,III,MD 1. Lymph node, biopsy, Right axillary- ONE LYMPH NODE, NEGATIVE FOR TUMOR (0/1). 2. Lymph node, sentinel, biopsy, Right axillary- ONE LYMPH NODE, NEGATIVE FOR TUMOR (0/1). 3. Breast, lumpectomy, Right- INVASIVE DUCTAL CARICNOMA, SEE COMMENT.- DUCTAL CARCINOMA IN SITU.- PREVIOUS BIOPSY SITE IDENTIFIED.- SEE TUMOR SYNOPTIC TEMPLATE BELOW.1 of 4 Microscopic Comment 3. BREAST, INVASIVE TUMOR, WITH LYMPH NODES PRESENT  Past/Anticipated interventions by medical oncology, if any: Chemotherapy :Dr. Lindi Adie, Oncotype testing  Results=0/3%     Lymphedema issues, if any: NO  Pain issues, if any:  No  SAFETY ISSUES:NO  Prior radiation? NO  Pacemaker/ICD? NO  Possible current pregnancy? NO  Is the patient on methotrexate? NO  Current Complaints / other details: 01/29/15 seen in Breast Clinic, Divorced,  Menarche   Age 69 years, G62P2, first live birth age 87,  hx oral contraceptives, and Hrt 10 years,   Family hx 1st cousin breast cancer age 105, non smoker,non smokeless, no drug use, occasional alcohol use, ss tobacco user   Allergies: Sinvadene, Contrast media=rash    Rebecca Eaton, RN 02/27/2015,12:07 PM

## 2015-02-28 ENCOUNTER — Ambulatory Visit
Admission: RE | Admit: 2015-02-28 | Discharge: 2015-02-28 | Disposition: A | Discharge status: Home / Self Care | Payer: Medicare Other | Source: Ambulatory Visit | Attending: Radiation Oncology | Admitting: Radiation Oncology

## 2015-02-28 ENCOUNTER — Encounter: Payer: Self-pay | Admitting: Radiation Oncology

## 2015-02-28 VITALS — BP 135/81 | HR 85 | Temp 98.0°F | Resp 20 | Ht 64.0 in | Wt 148.4 lb

## 2015-02-28 DIAGNOSIS — Z510 Encounter for antineoplastic radiation therapy: Secondary | ICD-10-CM | POA: Insufficient documentation

## 2015-02-28 DIAGNOSIS — C50411 Malignant neoplasm of upper-outer quadrant of right female breast: Secondary | ICD-10-CM

## 2015-02-28 DIAGNOSIS — Z888 Allergy status to other drugs, medicaments and biological substances status: Secondary | ICD-10-CM | POA: Insufficient documentation

## 2015-02-28 DIAGNOSIS — Z91041 Radiographic dye allergy status: Secondary | ICD-10-CM | POA: Insufficient documentation

## 2015-02-28 NOTE — Progress Notes (Signed)
Location of Breast Cancer:Right Breast Upper Outer Quadrant 11:00 position  Histology per Pathology Report: Diagnosis 01/18/15:Breast, right, needle core biopsy - INVASIVE MAMMARY CARCINOMA.- MAMMARY CARCINOMA IN SITU.  Receptor Status: ER(+100%), PR (+100%), Her2-neu ( KI 67-30%)   Did patient present with symptoms (if so, please note symptoms) or was this found on screening mammography?: Found on routine mammogram screening  Past/Anticipated interventions by surgeon, if KGY:[id number] 02/05/15:Dr. Eddie Dibbles Toth,III,MD 1. Lymph node, biopsy, Right axillary- ONE LYMPH NODE, NEGATIVE FOR TUMOR (0/1). 2. Lymph node, sentinel, biopsy, Right axillary- ONE LYMPH NODE, NEGATIVE FOR TUMOR (0/1). 3. Breast, lumpectomy, Right- INVASIVE DUCTAL CARICNOMA, SEE COMMENT.- DUCTAL CARCINOMA IN SITU.- PREVIOUS BIOPSY SITE IDENTIFIED.- SEE TUMOR SYNOPTIC TEMPLATE BELOW.1 of 4 Microscopic Comment 3. BREAST, INVASIVE TUMOR, WITH LYMPH NODES PRESENT  Past/Anticipated interventions by medical oncology, if any: Chemotherapy :Dr. Lindi Adie, Oncotype testing  Results=0/3%     Lymphedema issues, if any: NO  Pain issues, if any:  No  SAFETY ISSUES:NO  Prior radiation? NO  Pacemaker/ICD? NO  Possible current pregnancy? NO  Is the patient on methotrexate? NO  Current Complaints / other details: 01/29/15 seen in Breast Clinic, Divorced,  Menarche   Age 69 years, G66P2, first live birth age 4,  hx oral contraceptives, and Hrt 10 years,   Family hx 1st cousin breast cancer age 97, non smoker,non smokeless, no drug use, occasional alcohol use, ss tobacco user.k BP 135/81 mmHg  Pulse 85  Temp(Src) 98 F (36.7 C) (Oral)  Resp 20  Ht '5\' 4"'  (1.626 m)  Wt 148 lb 6.4 oz (67.314 kg)  BMI 25.46 kg/m2  Wt Readings from Last 3 Encounters:  02/28/15 148 lb 6.4 oz (67.314 kg)  02/15/15 147 lb 12.8 oz (67.042 kg)  02/12/15 147 lb 4.8 oz (66.815 kg)   Allergies: Sinvadene, Contrast media=rash    Rebecca Eaton,  RN 02/28/2015,3:33 PM

## 2015-02-28 NOTE — Progress Notes (Signed)
Radiation Oncology         [phone] ________________________________  Name: [name] MRN: [id number]  Date: 02/28/2015  DOB: 09-19-1945  Follow-Up Visit Note  CC: Jenny Reichmann, MD  Grace Isaac, MD  Diagnosis:    Breast cancer of upper-outer quadrant of right female breast [facility])   Staging form: Breast, AJCC 7th Edition     Clinical stage from 01/24/2015: Stage IA (T1c, N0, M0) - Unsigned     Pathologic stage from 02/07/2015: Stage IA (T1c, N0, cM0) - Signed by Enid Cutter, MD on 02/13/2015       Staging comments: Staged on final lumpectomy specimen by Dr. Donato Heinz   Narrative:  The patient returns today for follow-up.  She has completed lumpectomy (02/05/15) and now presents for further discussion and coordination of an anticipated course of radiation treatment. The patient has seen medical oncology. She will not receive chemotherapy. The patient will be receiving anti-hormonal treatment after radiation treatment. Oncotype testing has come back negative.   Patient notes she is healing well after surgery, with no pains or discomforts.                ALLERGIES:  is allergic to contrast media and silvadene.  Meds: Current Outpatient Prescriptions  Medication Sig Dispense Refill  . albuterol (PROVENTIL HFA;VENTOLIN HFA) 108 (90 BASE) MCG/ACT inhaler Inhale into the lungs every 6 (six) hours as needed for wheezing or shortness of breath.    Marland Kitchen amLODipine (NORVASC) 10 MG tablet Take 1 tablet (10 mg total) by mouth daily. 90 tablet 3  . aspirin EC 325 MG tablet Take 1 tablet (325 mg total) by mouth daily. 30 tablet 0  . Calcium Carbonate-Vitamin D (CALCIUM + D PO) Take 2 tablets by mouth daily.     . cetirizine (ZYRTEC) 10 MG tablet Take 10 mg by mouth daily as needed for allergies.    . cholecalciferol (VITAMIN D) 1000 UNITS tablet Take 1,000 Units by mouth daily.      Marland Kitchen ezetimibe (ZETIA) 10 MG tablet Take 1 tablet (10 mg total) by mouth daily. 30 tablet 11  .  hydrochlorothiazide (HYDRODIURIL) 25 MG tablet Take 1 tablet (25 mg total) by mouth daily. 90 tablet 3  . KLOR-CON 10 10 MEQ tablet Take 2 tablets (20 mEq total) by mouth daily. 180 tablet 3  . mesalamine (LIALDA) 1.2 G EC tablet Take 4,800 mg by mouth daily.     . montelukast (SINGULAIR) 10 MG tablet Take 1 tablet (10 mg total) by mouth at bedtime. 90 tablet 3  . Multiple Vitamin (MULTIVITAMIN) tablet Take 1 tablet by mouth daily.     Marland Kitchen SYNTHROID 112 MCG tablet Take 1 tablet (112 mcg total) by mouth daily before breakfast. 90 tablet 3  . oxyCODONE-acetaminophen (ROXICET) 5-325 MG per tablet Take 1-2 tablets by mouth every 4 (four) hours as needed. (Patient not taking: Reported on 02/28/2015) 50 tablet 0   No current facility-administered medications for this encounter.    Physical Findings: The patient is in no acute distress. Patient is alert and oriented.  height is 5\' 4"  (1.626 m) and weight is 148 lb 6.4 oz (67.314 kg). Her oral temperature is 98 F (36.7 C). Her blood pressure is 135/81 and her pulse is 85. Her respiration is 20. .     Lab Findings: Lab Results  Component Value Date   WBC 9.5 01/24/2015   HGB 13.0 01/24/2015   HCT 39.5 01/24/2015   MCV 89.0 01/24/2015  PLT 395 01/24/2015     Radiographic Findings: Nm Sentinel Node Inj-no Rpt (breast)  02/05/2015   CLINICAL DATA: right breast cancer   Sulfur colloid was injected intradermally by the nuclear medicine  technologist for breast cancer sentinel node localization.     Impression:     Breast cancer of upper-outer quadrant of right female breast [facility])  The patient has completed surgery. She is suitable to proceed with adjuvant radiotherapy at this time. Based on the Oncotype score, the patient will not be receiving adjuvant radiation treatment.  I once again discussed the rationale for radiation treatment in this setting. We discussed the potential benefit of radiation treatment in terms of local/regional control.  We also discussed the possible side effects and risks of a possible course of radiation treatment. All of her questions were answered.  The patient does wish to proceed with radiation treatment.  Plan:  The patient will be scheduled for a simulation in the near future such that we can proceed with radiation treatment planning. I anticipate treating the patient for approximately 4 weeks to the right breast.    ------------------------------------------------  [name], MD, PhD  This document serves as a record of services personally performed by Kyung Rudd, MD. It was created on his behalf by Derek Mound, a trained medical scribe. The creation of this record is based on the scribe's personal observations and the provider's statements to them. This document has been checked and approved by the attending provider.

## 2015-02-28 NOTE — Progress Notes (Signed)
Please see the Nurse Progress Note in the MD Initial Consult Encounter for this patient. 

## 2015-03-07 ENCOUNTER — Ambulatory Visit
Admission: RE | Admit: 2015-03-07 | Discharge: 2015-03-07 | Disposition: A | Discharge status: Home / Self Care | Payer: Medicare Other | Source: Ambulatory Visit | Attending: Radiation Oncology | Admitting: Radiation Oncology

## 2015-03-07 DIAGNOSIS — C50411 Malignant neoplasm of upper-outer quadrant of right female breast: Secondary | ICD-10-CM

## 2015-03-07 DIAGNOSIS — Z91041 Radiographic dye allergy status: Secondary | ICD-10-CM | POA: Diagnosis not present

## 2015-03-07 DIAGNOSIS — Z510 Encounter for antineoplastic radiation therapy: Secondary | ICD-10-CM | POA: Diagnosis not present

## 2015-03-07 DIAGNOSIS — Z888 Allergy status to other drugs, medicaments and biological substances status: Secondary | ICD-10-CM | POA: Diagnosis not present

## 2015-03-08 ENCOUNTER — Other Ambulatory Visit: Payer: Self-pay | Admitting: *Deleted

## 2015-03-08 NOTE — Progress Notes (Signed)
  Radiation Oncology         [phone] ________________________________  Name: [name] MRN: [id number]  Date: 03/07/2015  DOB: 1946-05-17  SIMULATION AND TREATMENT PLANNING NOTE  The patient presented for simulation prior to beginning her course of radiation treatment for her diagnosis of Right-sided breast cancer. The patient was placed in a supine position on a breast board. A customized vac-lock bag was constructed and this complex treatment device will be used on a daily basis during her treatment. In this fashion, a CT scan was obtained through the chest area and an isocenter was placed near the chest wall within the breast.  The patient will be planned to receive a course of radiation initially to a dose of 42.5 Gy. This will consist of a whole breast radiotherapy technique. To accomplish this, 2 customized blocks have been designed which will correspond to medial and lateral whole breast tangent fields. This treatment will be accomplished at 2.5 Gy per fraction. A forward planning technique will also be evaluated to determine if this approach improves the plan. It is anticipated that the patient will then receive a 7.5 Gy boost to the seroma cavity which has been contoured. This will be accomplished at 2.5 Gy per fraction.   This initial treatment will consist of a 3-D conformal technique. The seroma has been contoured as the primary target structure. Additionally, dose volume histograms of both this target as well as the lungs and heart will also be evaluated. Such an approach is necessary to ensure that the target area is adequately covered while the nearby critical  normal structures are adequately spared.  Plan:  The final anticipated total dose therefore will correspond to 50 Gy.    _______________________________   [name], MD, PhD

## 2015-03-08 NOTE — Progress Notes (Signed)
  Radiation Oncology         [phone] ________________________________  Name: MARILI VADER MRN: [id number]  Date: 03/07/2015  DOB: 1946-05-06  Optical Surface Tracking Plan:  Since intensity modulated radiotherapy (IMRT) and 3D conformal radiation treatment methods are predicated on accurate and precise positioning for treatment, intrafraction motion monitoring is medically necessary to ensure accurate and safe treatment delivery.  The ability to quantify intrafraction motion without excessive ionizing radiation dose can only be performed with optical surface tracking. Accordingly, surface imaging offers the opportunity to obtain 3D measurements of patient position throughout IMRT and 3D treatments without excessive radiation exposure.  I am ordering optical surface tracking for this patient's upcoming course of radiotherapy. ________________________________  Kyung Rudd, MD 03/08/2015 5:33 PM    Reference:   Ursula Alert, J, et al. Surface imaging-based analysis of intrafraction motion for breast radiotherapy patients.Journal of [place], n. 6, nov. 2014. ISSN [id number].   Available at: <http://www.jacmp.org/index.php/jacmp/article/view/4957>.

## 2015-03-09 ENCOUNTER — Telehealth: Payer: Self-pay | Admitting: Hematology and Oncology

## 2015-03-09 NOTE — Telephone Encounter (Signed)
s.w. pt and advised on NOV appt....ok and aware

## 2015-03-12 ENCOUNTER — Encounter: Payer: Self-pay | Admitting: Radiation Oncology

## 2015-03-12 NOTE — Progress Notes (Signed)
Returned pt's call in reference to financial assistance. Pt was needing assistance with copay's for physician bills she had received. Advised pt that we do not have assistance for copay office visits but we do for certain copay's for treatment and to contact number on bill and make arrangements with them directly. Pt verbalized understanding.

## 2015-03-14 ENCOUNTER — Ambulatory Visit
Admission: RE | Admit: 2015-03-14 | Discharge: 2015-03-14 | Disposition: A | Discharge status: Home / Self Care | Payer: Medicare Other | Source: Ambulatory Visit | Attending: Radiation Oncology | Admitting: Radiation Oncology

## 2015-03-14 DIAGNOSIS — Z888 Allergy status to other drugs, medicaments and biological substances status: Secondary | ICD-10-CM | POA: Diagnosis not present

## 2015-03-14 DIAGNOSIS — Z91041 Radiographic dye allergy status: Secondary | ICD-10-CM | POA: Diagnosis not present

## 2015-03-14 DIAGNOSIS — C50411 Malignant neoplasm of upper-outer quadrant of right female breast: Secondary | ICD-10-CM | POA: Diagnosis not present

## 2015-03-14 DIAGNOSIS — Z510 Encounter for antineoplastic radiation therapy: Secondary | ICD-10-CM | POA: Diagnosis not present

## 2015-03-15 ENCOUNTER — Ambulatory Visit
Admission: RE | Admit: 2015-03-15 | Discharge: 2015-03-15 | Disposition: A | Discharge status: Home / Self Care | Payer: Medicare Other | Source: Ambulatory Visit | Attending: Radiation Oncology | Admitting: Radiation Oncology

## 2015-03-15 VITALS — Wt 147.2 lb

## 2015-03-15 DIAGNOSIS — C50411 Malignant neoplasm of upper-outer quadrant of right female breast: Secondary | ICD-10-CM

## 2015-03-15 DIAGNOSIS — Z888 Allergy status to other drugs, medicaments and biological substances status: Secondary | ICD-10-CM | POA: Diagnosis not present

## 2015-03-15 DIAGNOSIS — Z91041 Radiographic dye allergy status: Secondary | ICD-10-CM | POA: Diagnosis not present

## 2015-03-15 DIAGNOSIS — Z510 Encounter for antineoplastic radiation therapy: Secondary | ICD-10-CM | POA: Diagnosis not present

## 2015-03-15 MED ORDER — ALRA NON-METALLIC DEODORANT (RAD-ONC)
1.0000 "application " | Freq: Once | TOPICAL | Status: AC
  Administered 2015-03-15: 1 via TOPICAL

## 2015-03-15 MED ORDER — RADIAPLEXRX EX GEL
Freq: Once | CUTANEOUS | Status: AC
  Administered 2015-03-15: 13:00:00 via TOPICAL

## 2015-03-15 NOTE — Progress Notes (Signed)
Pt education done, radiation therapy and you book, my business card and flyer sheet on skin products, discussed ways to manage side effects, skin irritation, fatgiue, swelling, soreness, pain, increase protein in diet eat healthier, drink plenty of fluids stay hydrated, teach back given,  1:35 PM

## 2015-03-16 ENCOUNTER — Ambulatory Visit
Admission: RE | Admit: 2015-03-16 | Discharge: 2015-03-16 | Disposition: A | Discharge status: Home / Self Care | Payer: Medicare Other | Source: Ambulatory Visit | Attending: Radiation Oncology | Admitting: Radiation Oncology

## 2015-03-16 ENCOUNTER — Encounter: Payer: Self-pay | Admitting: Radiation Oncology

## 2015-03-16 VITALS — BP 136/76 | HR 78 | Resp 16 | Wt 149.1 lb

## 2015-03-16 DIAGNOSIS — Z91041 Radiographic dye allergy status: Secondary | ICD-10-CM | POA: Diagnosis not present

## 2015-03-16 DIAGNOSIS — C50411 Malignant neoplasm of upper-outer quadrant of right female breast: Secondary | ICD-10-CM | POA: Diagnosis not present

## 2015-03-16 DIAGNOSIS — Z888 Allergy status to other drugs, medicaments and biological substances status: Secondary | ICD-10-CM | POA: Diagnosis not present

## 2015-03-16 DIAGNOSIS — Z510 Encounter for antineoplastic radiation therapy: Secondary | ICD-10-CM | POA: Diagnosis not present

## 2015-03-16 NOTE — Progress Notes (Signed)
Weight and vitals stable. Denies pain. No skin changes noted within treatment field. Denies fatigue. Patient has an understanding of radiaplex and alra use.   BP 136/76 mmHg  Pulse 78  Resp 16  Wt 149 lb 1.6 oz (67.631 kg)  SpO2 100% Wt Readings from Last 3 Encounters:  03/16/15 149 lb 1.6 oz (67.631 kg)  03/15/15 147 lb 3.2 oz (66.769 kg)  02/28/15 148 lb 6.4 oz (67.314 kg)

## 2015-03-16 NOTE — Progress Notes (Signed)
Department of Radiation Oncology  Phone:  [phone] Fax:        [phone]  Weekly Treatment Note    Name: [name] Date: 03/16/2015 MRN: [id number] DOB: 07-03-1945   Current dose: 5 Gy  Current fraction: 2   MEDICATIONS: Current Outpatient Prescriptions  Medication Sig Dispense Refill  . albuterol (PROVENTIL HFA;VENTOLIN HFA) 108 (90 BASE) MCG/ACT inhaler Inhale into the lungs every 6 (six) hours as needed for wheezing or shortness of breath.    Marland Kitchen amLODipine (NORVASC) 10 MG tablet Take 1 tablet (10 mg total) by mouth daily. 90 tablet 3  . aspirin EC 325 MG tablet Take 1 tablet (325 mg total) by mouth daily. 30 tablet 0  . Calcium Carbonate-Vitamin D (CALCIUM + D PO) Take 2 tablets by mouth daily.     . cetirizine (ZYRTEC) 10 MG tablet Take 10 mg by mouth daily as needed for allergies.    . cholecalciferol (VITAMIN D) 1000 UNITS tablet Take 1,000 Units by mouth daily.      Marland Kitchen ezetimibe (ZETIA) 10 MG tablet Take 1 tablet (10 mg total) by mouth daily. 30 tablet 11  . hyaluronate sodium (RADIAPLEXRX) GEL Apply 1 application topically 2 (two) times daily.    . hydrochlorothiazide (HYDRODIURIL) 25 MG tablet Take 1 tablet (25 mg total) by mouth daily. 90 tablet 3  . KLOR-CON 10 10 MEQ tablet Take 2 tablets (20 mEq total) by mouth daily. 180 tablet 3  . mesalamine (LIALDA) 1.2 G EC tablet Take 4,800 mg by mouth daily.     . montelukast (SINGULAIR) 10 MG tablet Take 1 tablet (10 mg total) by mouth at bedtime. 90 tablet 3  . Multiple Vitamin (MULTIVITAMIN) tablet Take 1 tablet by mouth daily.     . non-metallic deodorant Jethro Poling) MISC Apply 1 application topically daily as needed.    Marland Kitchen SYNTHROID 112 MCG tablet Take 1 tablet (112 mcg total) by mouth daily before breakfast. 90 tablet 3  . oxyCODONE-acetaminophen (ROXICET) 5-325 MG per tablet Take 1-2 tablets by mouth every 4 (four) hours as needed. (Patient not taking: Reported on 02/28/2015) 50 tablet 0   No current  facility-administered medications for this encounter.     ALLERGIES: Contrast media and Silvadene   LABORATORY DATA:  Lab Results  Component Value Date   WBC 9.5 01/24/2015   HGB 13.0 01/24/2015   HCT 39.5 01/24/2015   MCV 89.0 01/24/2015   PLT 395 01/24/2015   Lab Results  Component Value Date   NA 139 02/15/2015   K 3.5 02/15/2015   CL 101 02/15/2015   CO2 30 02/15/2015   Lab Results  Component Value Date   ALT 12 01/24/2015   AST 15 01/24/2015   ALKPHOS 85 01/24/2015   BILITOT 0.43 01/24/2015     NARRATIVE: [name] was seen today for weekly treatment management. The chart was checked and the patient's films were reviewed.  Weight and vitals stable. Denies pain. No skin changes noted within treatment field. Denies fatigue. Patient has an understanding of radiaplex and alra use.  BP 136/76 mmHg  Pulse 78  Resp 16  Wt 149 lb 1.6 oz (67.631 kg)  SpO2 100%  Wt Readings from Last 3 Encounters:   03/16/15  149 lb 1.6 oz (67.631 kg)   03/15/15  147 lb 3.2 oz (66.769 kg)   02/28/15  148 lb 6.4 oz (67.314 kg)      PHYSICAL EXAMINATION: weight is 149 lb 1.6 oz (67.631 kg).  Her blood pressure is 136/76 and her pulse is 78. Her respiration is 16 and oxygen saturation is 100%.        ASSESSMENT: The patient is doing satisfactorily with treatment.  PLAN: We will continue with the patient's radiation treatment as planned.   This document serves as a record of services personally performed by Kyung Rudd, MD. It was created on his behalf by Arlyce Harman, a trained medical scribe. The creation of this record is based on the scribe's personal observations and the provider's statements to them. This document has been checked and approved by the attending provider. ------------------------------------------------  [name], MD, PhD

## 2015-03-19 ENCOUNTER — Ambulatory Visit
Admission: RE | Admit: 2015-03-19 | Discharge: 2015-03-19 | Disposition: A | Discharge status: Home / Self Care | Payer: Medicare Other | Source: Ambulatory Visit | Attending: Radiation Oncology | Admitting: Radiation Oncology

## 2015-03-19 DIAGNOSIS — C50411 Malignant neoplasm of upper-outer quadrant of right female breast: Secondary | ICD-10-CM | POA: Diagnosis not present

## 2015-03-19 DIAGNOSIS — Z91041 Radiographic dye allergy status: Secondary | ICD-10-CM | POA: Diagnosis not present

## 2015-03-19 DIAGNOSIS — Z510 Encounter for antineoplastic radiation therapy: Secondary | ICD-10-CM | POA: Diagnosis not present

## 2015-03-19 DIAGNOSIS — Z888 Allergy status to other drugs, medicaments and biological substances status: Secondary | ICD-10-CM | POA: Diagnosis not present

## 2015-03-20 ENCOUNTER — Ambulatory Visit
Admission: RE | Admit: 2015-03-20 | Discharge: 2015-03-20 | Disposition: A | Discharge status: Home / Self Care | Payer: Medicare Other | Source: Ambulatory Visit | Attending: Radiation Oncology | Admitting: Radiation Oncology

## 2015-03-20 DIAGNOSIS — Z510 Encounter for antineoplastic radiation therapy: Secondary | ICD-10-CM | POA: Diagnosis not present

## 2015-03-20 DIAGNOSIS — Z91041 Radiographic dye allergy status: Secondary | ICD-10-CM | POA: Diagnosis not present

## 2015-03-20 DIAGNOSIS — C50411 Malignant neoplasm of upper-outer quadrant of right female breast: Secondary | ICD-10-CM | POA: Diagnosis not present

## 2015-03-20 DIAGNOSIS — Z888 Allergy status to other drugs, medicaments and biological substances status: Secondary | ICD-10-CM | POA: Diagnosis not present

## 2015-03-21 ENCOUNTER — Ambulatory Visit
Admission: RE | Admit: 2015-03-21 | Discharge: 2015-03-21 | Disposition: A | Discharge status: Home / Self Care | Payer: Medicare Other | Source: Ambulatory Visit | Attending: Radiation Oncology | Admitting: Radiation Oncology

## 2015-03-21 ENCOUNTER — Encounter: Payer: Self-pay | Admitting: Radiation Oncology

## 2015-03-21 DIAGNOSIS — Z91041 Radiographic dye allergy status: Secondary | ICD-10-CM | POA: Diagnosis not present

## 2015-03-21 DIAGNOSIS — Z888 Allergy status to other drugs, medicaments and biological substances status: Secondary | ICD-10-CM | POA: Diagnosis not present

## 2015-03-21 DIAGNOSIS — Z510 Encounter for antineoplastic radiation therapy: Secondary | ICD-10-CM | POA: Diagnosis not present

## 2015-03-21 DIAGNOSIS — C50411 Malignant neoplasm of upper-outer quadrant of right female breast: Secondary | ICD-10-CM | POA: Diagnosis not present

## 2015-03-21 NOTE — Progress Notes (Signed)
Patient had called earlier stating she had a rash under her right arm, stop using the deodorant and try [employer], or 50/50  Of baking soda /corn starch,  miconazole powder  Given to patient, rash looks like yeast, will check her again Friday after treatment with MD visit 2:11 PM

## 2015-03-22 ENCOUNTER — Telehealth: Payer: Self-pay | Admitting: *Deleted

## 2015-03-22 ENCOUNTER — Ambulatory Visit
Admission: RE | Admit: 2015-03-22 | Discharge: 2015-03-22 | Disposition: A | Discharge status: Home / Self Care | Payer: Medicare Other | Source: Ambulatory Visit | Attending: Radiation Oncology | Admitting: Radiation Oncology

## 2015-03-22 DIAGNOSIS — Z510 Encounter for antineoplastic radiation therapy: Secondary | ICD-10-CM | POA: Diagnosis not present

## 2015-03-22 DIAGNOSIS — Z91041 Radiographic dye allergy status: Secondary | ICD-10-CM | POA: Diagnosis not present

## 2015-03-22 DIAGNOSIS — Z888 Allergy status to other drugs, medicaments and biological substances status: Secondary | ICD-10-CM | POA: Diagnosis not present

## 2015-03-22 DIAGNOSIS — C50411 Malignant neoplasm of upper-outer quadrant of right female breast: Secondary | ICD-10-CM | POA: Diagnosis not present

## 2015-03-22 NOTE — Telephone Encounter (Signed)
Left vm for pt to return call to assess needs during xrt. Contact information provided. 

## 2015-03-23 ENCOUNTER — Ambulatory Visit
Admission: RE | Admit: 2015-03-23 | Discharge: 2015-03-23 | Disposition: A | Discharge status: Home / Self Care | Payer: Medicare Other | Source: Ambulatory Visit | Attending: Radiation Oncology | Admitting: Radiation Oncology

## 2015-03-23 ENCOUNTER — Telehealth: Payer: Self-pay | Admitting: *Deleted

## 2015-03-23 ENCOUNTER — Encounter: Payer: Self-pay | Admitting: Radiation Oncology

## 2015-03-23 VITALS — BP 131/72 | HR 79 | Temp 98.1°F | Resp 20 | Wt 147.6 lb

## 2015-03-23 DIAGNOSIS — Z91041 Radiographic dye allergy status: Secondary | ICD-10-CM | POA: Diagnosis not present

## 2015-03-23 DIAGNOSIS — Z510 Encounter for antineoplastic radiation therapy: Secondary | ICD-10-CM | POA: Diagnosis not present

## 2015-03-23 DIAGNOSIS — C50411 Malignant neoplasm of upper-outer quadrant of right female breast: Secondary | ICD-10-CM | POA: Diagnosis not present

## 2015-03-23 DIAGNOSIS — Z888 Allergy status to other drugs, medicaments and biological substances status: Secondary | ICD-10-CM | POA: Diagnosis not present

## 2015-03-23 NOTE — Telephone Encounter (Signed)
Pt returned call. Relate doing well and without complaints during xrt. Encourage pt to call with needs or questions. Received verbal understanding. Discussed survivorship program and referral after completion of xrt.

## 2015-03-23 NOTE — Progress Notes (Signed)
Department of Radiation Oncology  Phone:  [phone] Fax:        [phone]  Weekly Treatment Note    Name: [name] Date: 03/23/2015 MRN: [id number] DOB: June 29, 1945   Current dose: 17.5 Gy  Current fraction: 7   MEDICATIONS: Current Outpatient Prescriptions  Medication Sig Dispense Refill  . albuterol (PROVENTIL HFA;VENTOLIN HFA) 108 (90 BASE) MCG/ACT inhaler Inhale into the lungs every 6 (six) hours as needed for wheezing or shortness of breath.    Marland Kitchen amLODipine (NORVASC) 10 MG tablet Take 1 tablet (10 mg total) by mouth daily. 90 tablet 3  . aspirin EC 325 MG tablet Take 1 tablet (325 mg total) by mouth daily. 30 tablet 0  . Calcium Carbonate-Vitamin D (CALCIUM + D PO) Take 2 tablets by mouth daily.     . cetirizine (ZYRTEC) 10 MG tablet Take 10 mg by mouth daily as needed for allergies.    . cholecalciferol (VITAMIN D) 1000 UNITS tablet Take 1,000 Units by mouth daily.      Marland Kitchen ezetimibe (ZETIA) 10 MG tablet Take 1 tablet (10 mg total) by mouth daily. 30 tablet 11  . hyaluronate sodium (RADIAPLEXRX) GEL Apply 1 application topically 2 (two) times daily.    . hydrochlorothiazide (HYDRODIURIL) 25 MG tablet Take 1 tablet (25 mg total) by mouth daily. 90 tablet 3  . KLOR-CON 10 10 MEQ tablet Take 2 tablets (20 mEq total) by mouth daily. 180 tablet 3  . mesalamine (LIALDA) 1.2 G EC tablet Take 4,800 mg by mouth daily.     . montelukast (SINGULAIR) 10 MG tablet Take 1 tablet (10 mg total) by mouth at bedtime. 90 tablet 3  . Multiple Vitamin (MULTIVITAMIN) tablet Take 1 tablet by mouth daily.     Marland Kitchen SYNTHROID 112 MCG tablet Take 1 tablet (112 mcg total) by mouth daily before breakfast. 90 tablet 3  . oxyCODONE-acetaminophen (ROXICET) 5-325 MG per tablet Take 1-2 tablets by mouth every 4 (four) hours as needed. (Patient not taking: Reported on 02/28/2015) 50 tablet 0   No current facility-administered medications for this encounter.     ALLERGIES: Contrast media and  Silvadene   LABORATORY DATA:  Lab Results  Component Value Date   WBC 9.5 01/24/2015   HGB 13.0 01/24/2015   HCT 39.5 01/24/2015   MCV 89.0 01/24/2015   PLT 395 01/24/2015   Lab Results  Component Value Date   NA 139 02/15/2015   K 3.5 02/15/2015   CL 101 02/15/2015   CO2 30 02/15/2015   Lab Results  Component Value Date   ALT 12 01/24/2015   AST 15 01/24/2015   ALKPHOS 85 01/24/2015   BILITOT 0.43 01/24/2015     NARRATIVE: Curley B Califano was seen today for weekly treatment management. The chart was checked and the patient's films were reviewed.  wekly rad txs right breast, 7/20 completed; using radiaplex bid, no skin changes, she  stopped the alra deodorant, rash broke out under left axilla, was given miconozole powder, didn't help stated patient, slight itchiness  there,  Appetite good, enenrgy level better than she thought 4:04 PM BP 131/72 mmHg  Pulse 79  Temp(Src) 98.1 F (36.7 C) (Oral)  Resp 20  Wt 147 lb 9.6 oz (66.951 kg)  Wt Readings from Last 3 Encounters:  03/23/15 147 lb 9.6 oz (66.951 kg)  03/16/15 149 lb 1.6 oz (67.631 kg)  03/15/15 147 lb 3.2 oz (66.769 kg)    PHYSICAL EXAMINATION: weight is 147 lb  9.6 oz (66.951 kg). Her oral temperature is 98.1 F (36.7 C). Her blood pressure is 131/72 and her pulse is 79. Her respiration is 20.      the patient's skin in the right breast looks very good. She has a rash in the left axilla on the contralateral side from her treatment.  ASSESSMENT: The patient is doing satisfactorily with treatment.  PLAN: We will continue with the patient's radiation treatment as planned. The patient will continue to use antifungal powder on the left. If this does not improve over the weekend, the patient may need to begin using Benadryl cream next week.

## 2015-03-23 NOTE — Progress Notes (Signed)
wekly rad txs right breast, 7/20 completed; using radiaplex bid, no skin changes, she  stopped the alra deodorant, rash broke out under left axilla, was given miconozole powder, didn't help stated patient, slight itchiness  there,  Appetite good, enenrgy level better than she thought 2:40 PM BP 131/72 mmHg  Pulse 79  Temp(Src) 98.1 F (36.7 C) (Oral)  Resp 20  Wt 147 lb 9.6 oz (66.951 kg)  Wt Readings from Last 3 Encounters:  03/23/15 147 lb 9.6 oz (66.951 kg)  03/16/15 149 lb 1.6 oz (67.631 kg)  03/15/15 147 lb 3.2 oz (66.769 kg)

## 2015-03-25 ENCOUNTER — Ambulatory Visit: Admission: RE | Admit: 2015-03-25 | Payer: Medicare Other | Source: Ambulatory Visit

## 2015-03-26 ENCOUNTER — Ambulatory Visit
Admission: RE | Admit: 2015-03-26 | Discharge: 2015-03-26 | Disposition: A | Discharge status: Home / Self Care | Payer: Medicare Other | Source: Ambulatory Visit | Attending: Radiation Oncology | Admitting: Radiation Oncology

## 2015-03-26 ENCOUNTER — Ambulatory Visit: Payer: Medicare Other

## 2015-03-26 DIAGNOSIS — Z91041 Radiographic dye allergy status: Secondary | ICD-10-CM | POA: Diagnosis not present

## 2015-03-26 DIAGNOSIS — C50411 Malignant neoplasm of upper-outer quadrant of right female breast: Secondary | ICD-10-CM | POA: Diagnosis not present

## 2015-03-26 DIAGNOSIS — Z510 Encounter for antineoplastic radiation therapy: Secondary | ICD-10-CM | POA: Diagnosis not present

## 2015-03-26 DIAGNOSIS — Z888 Allergy status to other drugs, medicaments and biological substances status: Secondary | ICD-10-CM | POA: Diagnosis not present

## 2015-03-27 ENCOUNTER — Ambulatory Visit
Admission: RE | Admit: 2015-03-27 | Discharge: 2015-03-27 | Disposition: A | Discharge status: Home / Self Care | Payer: Medicare Other | Source: Ambulatory Visit | Attending: Radiation Oncology | Admitting: Radiation Oncology

## 2015-03-27 DIAGNOSIS — Z91041 Radiographic dye allergy status: Secondary | ICD-10-CM | POA: Diagnosis not present

## 2015-03-27 DIAGNOSIS — Z888 Allergy status to other drugs, medicaments and biological substances status: Secondary | ICD-10-CM | POA: Diagnosis not present

## 2015-03-27 DIAGNOSIS — Z510 Encounter for antineoplastic radiation therapy: Secondary | ICD-10-CM | POA: Diagnosis not present

## 2015-03-27 DIAGNOSIS — C50411 Malignant neoplasm of upper-outer quadrant of right female breast: Secondary | ICD-10-CM | POA: Diagnosis not present

## 2015-03-28 ENCOUNTER — Ambulatory Visit
Admission: RE | Admit: 2015-03-28 | Discharge: 2015-03-28 | Disposition: A | Discharge status: Home / Self Care | Payer: Medicare Other | Source: Ambulatory Visit | Attending: Radiation Oncology | Admitting: Radiation Oncology

## 2015-03-28 DIAGNOSIS — Z91041 Radiographic dye allergy status: Secondary | ICD-10-CM | POA: Diagnosis not present

## 2015-03-28 DIAGNOSIS — C50411 Malignant neoplasm of upper-outer quadrant of right female breast: Secondary | ICD-10-CM | POA: Diagnosis not present

## 2015-03-28 DIAGNOSIS — Z510 Encounter for antineoplastic radiation therapy: Secondary | ICD-10-CM | POA: Diagnosis not present

## 2015-03-28 DIAGNOSIS — Z888 Allergy status to other drugs, medicaments and biological substances status: Secondary | ICD-10-CM | POA: Diagnosis not present

## 2015-03-29 ENCOUNTER — Ambulatory Visit
Admission: RE | Admit: 2015-03-29 | Discharge: 2015-03-29 | Disposition: A | Discharge status: Home / Self Care | Payer: Medicare Other | Source: Ambulatory Visit | Attending: Radiation Oncology | Admitting: Radiation Oncology

## 2015-03-29 DIAGNOSIS — Z91041 Radiographic dye allergy status: Secondary | ICD-10-CM | POA: Diagnosis not present

## 2015-03-29 DIAGNOSIS — Z510 Encounter for antineoplastic radiation therapy: Secondary | ICD-10-CM | POA: Diagnosis not present

## 2015-03-29 DIAGNOSIS — C50411 Malignant neoplasm of upper-outer quadrant of right female breast: Secondary | ICD-10-CM | POA: Diagnosis not present

## 2015-03-29 DIAGNOSIS — Z888 Allergy status to other drugs, medicaments and biological substances status: Secondary | ICD-10-CM | POA: Diagnosis not present

## 2015-03-30 ENCOUNTER — Ambulatory Visit
Admission: RE | Admit: 2015-03-30 | Discharge: 2015-03-30 | Disposition: A | Discharge status: Home / Self Care | Payer: Medicare Other | Source: Ambulatory Visit | Attending: Radiation Oncology | Admitting: Radiation Oncology

## 2015-03-30 ENCOUNTER — Encounter: Payer: Self-pay | Admitting: Radiation Oncology

## 2015-03-30 VITALS — BP 134/74 | HR 74 | Temp 98.0°F | Ht 64.0 in | Wt 148.5 lb

## 2015-03-30 DIAGNOSIS — Z91041 Radiographic dye allergy status: Secondary | ICD-10-CM | POA: Diagnosis not present

## 2015-03-30 DIAGNOSIS — Z510 Encounter for antineoplastic radiation therapy: Secondary | ICD-10-CM | POA: Diagnosis not present

## 2015-03-30 DIAGNOSIS — Z888 Allergy status to other drugs, medicaments and biological substances status: Secondary | ICD-10-CM | POA: Diagnosis not present

## 2015-03-30 DIAGNOSIS — C50411 Malignant neoplasm of upper-outer quadrant of right female breast: Secondary | ICD-10-CM | POA: Diagnosis not present

## 2015-03-30 NOTE — Progress Notes (Signed)
[name] has completed 12 fractions to her right breast.  She denies pain and fatigue.  She reports her rash under her left arm has spread across her chest.  She does have a red rash starting under her left arm extending to her left chest and she reports that it itches. She is using antifungal powder on this area.  The skin on her right breast has hyperpigmentation.  She is using radiaplex gel.  BP 134/74 mmHg  Pulse 74  Temp(Src) 98 F (36.7 C) (Oral)  Ht 5\' 4"  (1.626 m)  Wt 148 lb 8 oz (67.359 kg)  BMI 25.48 kg/m2

## 2015-03-30 NOTE — Progress Notes (Signed)
Department of Radiation Oncology  Phone:  [phone] Fax:        [phone]  Weekly Treatment Note    Name: [name] Date: 03/30/2015 MRN: [id number] DOB: 11/01/1945   Current dose: 30 Gy  Current fraction: 12   MEDICATIONS: Current Outpatient Prescriptions  Medication Sig Dispense Refill  . albuterol (PROVENTIL HFA;VENTOLIN HFA) 108 (90 BASE) MCG/ACT inhaler Inhale into the lungs every 6 (six) hours as needed for wheezing or shortness of breath.    Marland Kitchen amLODipine (NORVASC) 10 MG tablet Take 1 tablet (10 mg total) by mouth daily. 90 tablet 3  . aspirin EC 325 MG tablet Take 1 tablet (325 mg total) by mouth daily. 30 tablet 0  . Calcium Carbonate-Vitamin D (CALCIUM + D PO) Take 2 tablets by mouth daily.     . cetirizine (ZYRTEC) 10 MG tablet Take 10 mg by mouth daily as needed for allergies.    . cholecalciferol (VITAMIN D) 1000 UNITS tablet Take 1,000 Units by mouth daily.      Marland Kitchen ezetimibe (ZETIA) 10 MG tablet Take 1 tablet (10 mg total) by mouth daily. 30 tablet 11  . hyaluronate sodium (RADIAPLEXRX) GEL Apply 1 application topically 2 (two) times daily.    . hydrochlorothiazide (HYDRODIURIL) 25 MG tablet Take 1 tablet (25 mg total) by mouth daily. 90 tablet 3  . KLOR-CON 10 10 MEQ tablet Take 2 tablets (20 mEq total) by mouth daily. 180 tablet 3  . mesalamine (LIALDA) 1.2 G EC tablet Take 4,800 mg by mouth daily.     . montelukast (SINGULAIR) 10 MG tablet Take 1 tablet (10 mg total) by mouth at bedtime. 90 tablet 3  . Multiple Vitamin (MULTIVITAMIN) tablet Take 1 tablet by mouth daily.     Marland Kitchen SYNTHROID 112 MCG tablet Take 1 tablet (112 mcg total) by mouth daily before breakfast. 90 tablet 3   No current facility-administered medications for this encounter.     ALLERGIES: Contrast media and Silvadene   LABORATORY DATA:  Lab Results  Component Value Date   WBC 9.5 01/24/2015   HGB 13.0 01/24/2015   HCT 39.5 01/24/2015   MCV 89.0 01/24/2015   PLT 395  01/24/2015   Lab Results  Component Value Date   NA 139 02/15/2015   K 3.5 02/15/2015   CL 101 02/15/2015   CO2 30 02/15/2015   Lab Results  Component Value Date   ALT 12 01/24/2015   AST 15 01/24/2015   ALKPHOS 85 01/24/2015   BILITOT 0.43 01/24/2015     NARRATIVE: [name] was seen today for weekly treatment management. She denies symptoms of pain and fatigue.  However, she reports that she has a rash under her left arm that has spread across her chest and that it itches. To address this symptom of concern she began using antifungal powder on this area, starting last week. The patient is also administering Radiaplex gel to the area of treatment. "Before I could not stand for clothes to touch it," the patient stated, in regards to her breast pain/discomfort becoming less severe. The patient projected a healthy mental status and was not accompanied by family today. The chart was checked and the patient's films were reviewed.    11:39 AM BP 134/74 mmHg  Pulse 74  Temp(Src) 98 F (36.7 C) (Oral)  Ht 5\' 4"  (1.626 m)  Wt 148 lb 8 oz (67.359 kg)  BMI 25.48 kg/m2  Wt Readings from Last 3 Encounters:  03/30/15 148  lb 8 oz (67.359 kg)  03/23/15 147 lb 9.6 oz (66.951 kg)  03/16/15 149 lb 1.6 oz (67.631 kg)    PHYSICAL EXAMINATION: height is 5\' 4"  (1.626 m) and weight is 148 lb 8 oz (67.359 kg). Her oral temperature is 98 F (36.7 C). Her blood pressure is 134/74 and her pulse is 74.   The patient is alert and oriented x3. There is no significant changes to the status of overall health to be noted at this time. Breast: Mild hyperpigmentaiton in the right breast in the treatment area. The patient has a mild rash across the upper aspect of the left breast.  ASSESSMENT: [name] is a 69 year old female presenting to clinic in regards to her breast cancer of upper-outer quadrant of right female breast. The patient is doing satisfactorily with treatment. She is managing  reported symptoms appropriately. She understands the benefits and purpose of administering Radiaplex gel to areas of treatment. The patient understands that she can access her appointments and medical records via [place].  PLAN: We will continue with the patient's radiation treatment as planned. The patient has been advised to continue the use of antifungal powder and Radiaplex gel. Healthy methods of management in regards to other reported symptoms were reviewed. All vocalized questions and concerns have been addressed. If the patient develops any further questions or concerns in regards to her treatment and recovery, she has been encouraged to contact Dr. Lisbeth Renshaw, MD. She is aware of her follow-up appointment with radiation oncology to take place as scheduled, next Friday.    This document serves as a record of services personally performed by Kyung Rudd, MD. It was created on his behalf by Lenn Cal, a trained medical scribe. The creation of this record is based on the scribe's personal observations and the provider's statements to them. This document has been checked and approved by the attending provider.   ------------------------------------------------  [name], MD, PhD

## 2015-04-02 ENCOUNTER — Ambulatory Visit
Admission: RE | Admit: 2015-04-02 | Discharge: 2015-04-02 | Disposition: A | Discharge status: Home / Self Care | Payer: Medicare Other | Source: Ambulatory Visit | Attending: Radiation Oncology | Admitting: Radiation Oncology

## 2015-04-02 DIAGNOSIS — Z510 Encounter for antineoplastic radiation therapy: Secondary | ICD-10-CM | POA: Diagnosis not present

## 2015-04-02 DIAGNOSIS — C50411 Malignant neoplasm of upper-outer quadrant of right female breast: Secondary | ICD-10-CM | POA: Diagnosis not present

## 2015-04-02 DIAGNOSIS — Z888 Allergy status to other drugs, medicaments and biological substances status: Secondary | ICD-10-CM | POA: Diagnosis not present

## 2015-04-02 DIAGNOSIS — Z91041 Radiographic dye allergy status: Secondary | ICD-10-CM | POA: Diagnosis not present

## 2015-04-03 ENCOUNTER — Ambulatory Visit
Admission: RE | Admit: 2015-04-03 | Discharge: 2015-04-03 | Disposition: A | Discharge status: Home / Self Care | Payer: Medicare Other | Source: Ambulatory Visit | Attending: Radiation Oncology | Admitting: Radiation Oncology

## 2015-04-03 DIAGNOSIS — Z888 Allergy status to other drugs, medicaments and biological substances status: Secondary | ICD-10-CM | POA: Diagnosis not present

## 2015-04-03 DIAGNOSIS — Z91041 Radiographic dye allergy status: Secondary | ICD-10-CM | POA: Diagnosis not present

## 2015-04-03 DIAGNOSIS — C50411 Malignant neoplasm of upper-outer quadrant of right female breast: Secondary | ICD-10-CM | POA: Diagnosis not present

## 2015-04-03 DIAGNOSIS — Z510 Encounter for antineoplastic radiation therapy: Secondary | ICD-10-CM | POA: Diagnosis not present

## 2015-04-04 ENCOUNTER — Ambulatory Visit
Admission: RE | Admit: 2015-04-04 | Discharge: 2015-04-04 | Disposition: A | Discharge status: Home / Self Care | Payer: Medicare Other | Source: Ambulatory Visit | Attending: Radiation Oncology | Admitting: Radiation Oncology

## 2015-04-04 DIAGNOSIS — Z888 Allergy status to other drugs, medicaments and biological substances status: Secondary | ICD-10-CM | POA: Diagnosis not present

## 2015-04-04 DIAGNOSIS — Z91041 Radiographic dye allergy status: Secondary | ICD-10-CM | POA: Diagnosis not present

## 2015-04-04 DIAGNOSIS — C50411 Malignant neoplasm of upper-outer quadrant of right female breast: Secondary | ICD-10-CM | POA: Diagnosis not present

## 2015-04-04 DIAGNOSIS — Z510 Encounter for antineoplastic radiation therapy: Secondary | ICD-10-CM | POA: Diagnosis not present

## 2015-04-05 ENCOUNTER — Ambulatory Visit: Admission: RE | Admit: 2015-04-05 | Payer: Medicare Other | Source: Ambulatory Visit | Admitting: Radiation Oncology

## 2015-04-05 ENCOUNTER — Ambulatory Visit
Admission: RE | Admit: 2015-04-05 | Discharge: 2015-04-05 | Disposition: A | Discharge status: Home / Self Care | Payer: Medicare Other | Source: Ambulatory Visit | Attending: Radiation Oncology | Admitting: Radiation Oncology

## 2015-04-05 DIAGNOSIS — Z91041 Radiographic dye allergy status: Secondary | ICD-10-CM | POA: Diagnosis not present

## 2015-04-05 DIAGNOSIS — Z510 Encounter for antineoplastic radiation therapy: Secondary | ICD-10-CM | POA: Diagnosis not present

## 2015-04-05 DIAGNOSIS — Z888 Allergy status to other drugs, medicaments and biological substances status: Secondary | ICD-10-CM | POA: Diagnosis not present

## 2015-04-05 DIAGNOSIS — C50411 Malignant neoplasm of upper-outer quadrant of right female breast: Secondary | ICD-10-CM | POA: Diagnosis not present

## 2015-04-06 ENCOUNTER — Ambulatory Visit
Admission: RE | Admit: 2015-04-06 | Discharge: 2015-04-06 | Disposition: A | Discharge status: Home / Self Care | Payer: Medicare Other | Source: Ambulatory Visit | Attending: Radiation Oncology | Admitting: Radiation Oncology

## 2015-04-06 DIAGNOSIS — C50411 Malignant neoplasm of upper-outer quadrant of right female breast: Secondary | ICD-10-CM | POA: Diagnosis not present

## 2015-04-06 DIAGNOSIS — Z888 Allergy status to other drugs, medicaments and biological substances status: Secondary | ICD-10-CM | POA: Diagnosis not present

## 2015-04-06 DIAGNOSIS — Z510 Encounter for antineoplastic radiation therapy: Secondary | ICD-10-CM | POA: Diagnosis not present

## 2015-04-06 DIAGNOSIS — Z91041 Radiographic dye allergy status: Secondary | ICD-10-CM | POA: Diagnosis not present

## 2015-04-09 ENCOUNTER — Ambulatory Visit
Admission: RE | Admit: 2015-04-09 | Discharge: 2015-04-09 | Disposition: A | Discharge status: Home / Self Care | Payer: Medicare Other | Source: Ambulatory Visit | Attending: Radiation Oncology | Admitting: Radiation Oncology

## 2015-04-09 DIAGNOSIS — Z510 Encounter for antineoplastic radiation therapy: Secondary | ICD-10-CM | POA: Diagnosis not present

## 2015-04-09 DIAGNOSIS — Z91041 Radiographic dye allergy status: Secondary | ICD-10-CM | POA: Diagnosis not present

## 2015-04-09 DIAGNOSIS — Z888 Allergy status to other drugs, medicaments and biological substances status: Secondary | ICD-10-CM | POA: Diagnosis not present

## 2015-04-09 DIAGNOSIS — C50411 Malignant neoplasm of upper-outer quadrant of right female breast: Secondary | ICD-10-CM | POA: Diagnosis not present

## 2015-04-10 ENCOUNTER — Ambulatory Visit
Admission: RE | Admit: 2015-04-10 | Discharge: 2015-04-10 | Disposition: A | Discharge status: Home / Self Care | Payer: Medicare Other | Source: Ambulatory Visit | Attending: Radiation Oncology | Admitting: Radiation Oncology

## 2015-04-10 DIAGNOSIS — Z888 Allergy status to other drugs, medicaments and biological substances status: Secondary | ICD-10-CM | POA: Diagnosis not present

## 2015-04-10 DIAGNOSIS — C50411 Malignant neoplasm of upper-outer quadrant of right female breast: Secondary | ICD-10-CM | POA: Diagnosis not present

## 2015-04-10 DIAGNOSIS — Z510 Encounter for antineoplastic radiation therapy: Secondary | ICD-10-CM | POA: Diagnosis not present

## 2015-04-10 DIAGNOSIS — Z91041 Radiographic dye allergy status: Secondary | ICD-10-CM | POA: Diagnosis not present

## 2015-04-11 ENCOUNTER — Ambulatory Visit
Admission: RE | Admit: 2015-04-11 | Discharge: 2015-04-11 | Disposition: A | Discharge status: Home / Self Care | Payer: Medicare Other | Source: Ambulatory Visit | Attending: Radiation Oncology | Admitting: Radiation Oncology

## 2015-04-11 ENCOUNTER — Telehealth: Payer: Self-pay | Admitting: *Deleted

## 2015-04-11 ENCOUNTER — Encounter: Payer: Self-pay | Admitting: Radiation Oncology

## 2015-04-11 DIAGNOSIS — Z91041 Radiographic dye allergy status: Secondary | ICD-10-CM | POA: Diagnosis not present

## 2015-04-11 DIAGNOSIS — C50411 Malignant neoplasm of upper-outer quadrant of right female breast: Secondary | ICD-10-CM | POA: Diagnosis not present

## 2015-04-11 DIAGNOSIS — Z510 Encounter for antineoplastic radiation therapy: Secondary | ICD-10-CM | POA: Diagnosis not present

## 2015-04-11 DIAGNOSIS — Z888 Allergy status to other drugs, medicaments and biological substances status: Secondary | ICD-10-CM | POA: Diagnosis not present

## 2015-04-11 NOTE — Telephone Encounter (Signed)
Spoke with patient to follow up after completing radiation therapy.  Patient states she is doing well with no complaints.  Encouraged her to call with any needs or concerns.

## 2015-04-12 ENCOUNTER — Other Ambulatory Visit: Payer: Self-pay | Admitting: Adult Health

## 2015-04-12 DIAGNOSIS — C50411 Malignant neoplasm of upper-outer quadrant of right female breast: Secondary | ICD-10-CM

## 2015-04-12 NOTE — Assessment & Plan Note (Signed)
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 These include but not limited to insomnia, hot flashes, mood changes, vaginal dryness, bone density loss, and weight gain. We strongly believe that the benefits far outweigh the risks. Patient understands these risks and consented to starting treatment. Planned treatment duration is 5 years.  Return to clinic in 3 months for follow-up and toxicity check  

## 2015-04-13 ENCOUNTER — Telehealth: Payer: Self-pay | Admitting: Hematology and Oncology

## 2015-04-13 ENCOUNTER — Encounter: Payer: Self-pay | Admitting: Hematology and Oncology

## 2015-04-13 ENCOUNTER — Ambulatory Visit (HOSPITAL_BASED_OUTPATIENT_CLINIC_OR_DEPARTMENT_OTHER): Payer: Medicare Other | Admitting: Hematology and Oncology

## 2015-04-13 VITALS — BP 147/78 | HR 75 | Temp 98.1°F | Resp 18 | Ht 64.0 in | Wt 148.0 lb

## 2015-04-13 DIAGNOSIS — C50411 Malignant neoplasm of upper-outer quadrant of right female breast: Secondary | ICD-10-CM

## 2015-04-13 MED ORDER — ANASTROZOLE 1 MG PO TABS: 1.0000 mg | ORAL_TABLET | Freq: Every day | ORAL | Status: DC

## 2015-04-13 NOTE — Telephone Encounter (Signed)
Called patient for 67month followup,no answer,avs mailed to patient with appointmente

## 2015-04-13 NOTE — Addendum Note (Signed)
Addended by: Prentiss Bells on: 04/13/2015 03:45 PM   Modules accepted: Medications

## 2015-04-13 NOTE — Progress Notes (Signed)
Patient Care Team: Darlyne Russian, MD as PCP - General (Family Medicine) Autumn Messing III, MD as Consulting Physician (General Surgery) Nicholas Lose, MD as Consulting Physician (Hematology and Oncology) Kyung Rudd, MD as Consulting Physician (Radiation Oncology) Mauro Kaufmann, RN as Registered Nurse Rockwell Germany, RN as Registered Nurse Sylvan Cheese, NP as Nurse Practitioner (Nurse Practitioner)  DIAGNOSIS: Breast cancer of upper-outer quadrant of right female breast [facility])   Staging form: Breast, AJCC 7th Edition     Clinical stage from 01/24/2015: Stage IA (T1c, N0, M0) - Unsigned     Pathologic stage from 02/07/2015: Stage IA (T1c, N0, cM0) - Signed by Enid Cutter, MD on 02/13/2015       Staging comments: Staged on final lumpectomy specimen by Dr. 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 She had just completed radiation yesterday. She appears to be doing much better and recovering from it. She had a rash related to deodorant but otherwise she has tolerated it very well.  REVIEW OF SYSTEMS:   Constitutional: Denies fevers, chills or abnormal weight loss Eyes: Denies blurriness of vision Ears, nose, mouth, throat, and face: Denies  mucositis or sore throat Respiratory: Denies cough, dyspnea or wheezes Cardiovascular: Denies palpitation, chest discomfort or lower extremity swelling Gastrointestinal:  Denies nausea, heartburn or change in bowel habits Skin: Denies abnormal skin rashes Lymphatics: Denies new lymphadenopathy or easy bruising Neurological:Denies numbness, tingling or new weaknesses Behavioral/Psych: Mood is stable, no new changes   All other systems were reviewed with the patient and are negative.  I have reviewed the past medical history, past surgical history, social history and family history with the patient and they are unchanged from previous note.  ALLERGIES:  is allergic to contrast media and silvadene.  MEDICATIONS:  Current Outpatient Prescriptions  Medication Sig Dispense Refill  . albuterol (PROVENTIL HFA;VENTOLIN HFA) 108 (90 BASE) MCG/ACT inhaler Inhale into the lungs every 6 (six) hours as needed for wheezing or shortness of breath.    Marland Kitchen amLODipine (NORVASC) 10 MG tablet Take 1 tablet (10 mg total) by mouth daily. 90 tablet 3  . aspirin EC 325 MG tablet Take 1 tablet (325 mg total) by mouth daily. 30 tablet 0  . Calcium Carbonate-Vitamin D (CALCIUM + D PO) Take 2 tablets by mouth daily.     . cetirizine (ZYRTEC) 10 MG tablet Take 10 mg by mouth daily as needed for allergies.    . cholecalciferol (VITAMIN D) 1000 UNITS tablet Take 1,000 Units by mouth daily.      Marland Kitchen ezetimibe (ZETIA) 10 MG tablet Take 1 tablet (10 mg total) by mouth daily. 30 tablet 11  . hyaluronate sodium (RADIAPLEXRX) GEL Apply 1 application topically 2 (two) times daily.    . hydrochlorothiazide (HYDRODIURIL) 25 MG tablet  Take 1 tablet (25 mg total) by mouth daily. 90 tablet 3  . KLOR-CON 10 10 MEQ tablet Take 2 tablets (20 mEq total) by mouth daily. 180 tablet 3  . mesalamine (LIALDA) 1.2 G EC tablet Take 4,800 mg by mouth daily.     . montelukast (SINGULAIR) 10 MG tablet Take 1 tablet (10 mg total) by mouth at  bedtime. 90 tablet 3  . Multiple Vitamin (MULTIVITAMIN) tablet Take 1 tablet by mouth daily.     Marland Kitchen SYNTHROID 112 MCG tablet Take 1 tablet (112 mcg total) by mouth daily before breakfast. 90 tablet 3   No current facility-administered medications for this visit.    PHYSICAL EXAMINATION: ECOG PERFORMANCE STATUS: 1 - Symptomatic but completely ambulatory  Filed Vitals:   04/13/15 1157  BP: 147/78  Pulse: 75  Temp: 98.1 F (36.7 C)  Resp: 18   Filed Weights   04/13/15 1157  Weight: 148 lb (67.132 kg)    GENERAL:alert, no distress and comfortable SKIN: skin color, texture, turgor are normal, no rashes or significant lesions EYES: normal, Conjunctiva are pink and non-injected, sclera clear OROPHARYNX:no exudate, no erythema and lips, buccal mucosa, and tongue normal  NECK: supple, thyroid normal size, non-tender, without nodularity LYMPH:  no palpable lymphadenopathy in the cervical, axillary or inguinal LUNGS: clear to auscultation and percussion with normal breathing effort HEART: regular rate & rhythm and no murmurs and no lower extremity edema ABDOMEN:abdomen soft, non-tender and normal bowel sounds Musculoskeletal:no cyanosis of digits and no clubbing  NEURO: alert & oriented x 3 with fluent speech, no focal motor/sensory deficits   LABORATORY DATA:  I have reviewed the data as listed   Chemistry      Component Value Date/Time   NA 139 02/15/2015 1051   NA 141 01/24/2015 1238   K 3.5 02/15/2015 1051   K 3.4* 01/24/2015 1238   CL 101 02/15/2015 1051   CO2 30 02/15/2015 1051   CO2 31* 01/24/2015 1238   BUN 18 02/15/2015 1051   BUN 16.2 01/24/2015 1238   CREATININE 0.57 02/15/2015 1051   CREATININE 0.8 01/24/2015 1238   CREATININE 0.54 03/15/2013 0410      Component Value Date/Time   CALCIUM 9.8 02/15/2015 1051   CALCIUM 10.0 01/24/2015 1238   ALKPHOS 85 01/24/2015 1238   ALKPHOS 84 01/27/2014 1023   AST 15 01/24/2015 1238   AST 15 01/27/2014 1023   ALT 12  01/24/2015 1238   ALT 14 01/27/2014 1023   BILITOT 0.43 01/24/2015 1238   BILITOT 0.6 01/27/2014 1023       Lab Results  Component Value Date   WBC 9.5 01/24/2015   HGB 13.0 01/24/2015   HCT 39.5 01/24/2015   MCV 89.0 01/24/2015   PLT 395 01/24/2015   NEUTROABS 6.2 01/24/2015   ASSESSMENT & PLAN:  Breast cancer of upper-outer quadrant of right female breast Right lumpectomy 02/05/2015: Invasive ductal carcinoma 1.7 cm, grade 2, clear margins with DCIS, 0/2 lymph nodes negative,No LVI, ER 100%, PR 100%, HER-2 negative, Ki-67 30%, T1 cN0 stage IA Oncotype DX score 0, 3% risk of recurrence, status post radiation completed 04/11/2015  Treatment plan: Adjuvant antiestrogen therapy with anastrozole 1 mg daily 5 years We discussed the risks and benefits of anti-estrogen therapy with aromatase inhibitors. These include but not limited to insomnia, hot flashes, mood changes, vaginal dryness, bone density loss, and weight gain. We strongly believe that the benefits far outweigh the risks. Patient understands these risks and consented to  starting treatment. Planned treatment duration is 5 years.  Return to clinic in 3 months for follow-up and toxicity check   No orders of the defined types were placed in this encounter.   The patient has a good understanding of the overall plan. she agrees with it. she will call with any problems that may develop before the next visit here.   Rulon Eisenmenger, MD 04/13/2015

## 2015-04-18 NOTE — Progress Notes (Signed)
  Radiation Oncology         [phone] ________________________________  Name: [name] MRN: [id number]  Date: 03/21/2015  DOB: May 31, 1945  Complex simulation note  The patient has undergone complex simulation for her upcoming boost treatment for her diagnosis of breast cancer. The patient has initially been planned to receive 42.5 Gy. The patient will now receive a 7.5 Gy boost to the seroma cavity which has been contoured. This will be accomplished using an en face electron field. Based on the depth of the target area, 12 MeV electrons will be used. The patient's final total dose therefore will be 50 Gy. A complex isodose plan is requested for the boost treatment.   _______________________________  [name], MD, PhD

## 2015-04-18 NOTE — Progress Notes (Signed)
  Radiation Oncology         [phone] ________________________________  Name: IVAN BENNE MRN: [id number]  Date: 04/11/2015  DOB: 1945/08/29  End of Treatment Note  Diagnosis:   Right-sided breast cancer     Indication for treatment:  Curative       Radiation treatment dates:   03/23/2015 through 04/11/2015  Site/dose:   The patient initially received a dose of 42.5 Gy in 17 fractions to the breast using whole-breast tangent fields. This was delivered using a 3-D conformal technique. The patient then received a boost to the seroma. This delivered an additional 7.5 Gy in 3 fractions using an en face electron field due to the depth of the seroma. The total dose was 50 Gy.  Narrative: The patient tolerated radiation treatment relatively well.   The patient had some expected skin irritation as she progressed during treatment. Moist desquamation was not present at the end of treatment.  Plan: The patient has completed radiation treatment. The patient will return to radiation oncology clinic for routine followup in one month. I advised the patient to call or return sooner if they have any questions or concerns related to their recovery or treatment. ________________________________  [name], M.D., Ph.D.

## 2015-05-08 DIAGNOSIS — C50411 Malignant neoplasm of upper-outer quadrant of right female breast: Secondary | ICD-10-CM | POA: Diagnosis not present

## 2015-06-05 ENCOUNTER — Telehealth: Payer: Self-pay | Admitting: Hematology and Oncology

## 2015-06-05 NOTE — Telephone Encounter (Signed)
Appointment for survivorship made and patient is aware

## 2015-06-15 ENCOUNTER — Telehealth: Payer: Self-pay

## 2015-06-15 ENCOUNTER — Encounter: Payer: Self-pay | Admitting: Emergency Medicine

## 2015-06-15 ENCOUNTER — Ambulatory Visit (INDEPENDENT_AMBULATORY_CARE_PROVIDER_SITE_OTHER): Payer: Medicare Other | Admitting: Emergency Medicine

## 2015-06-15 VITALS — BP 142/82 | HR 76 | Temp 98.4°F | Resp 16 | Ht 65.0 in | Wt 148.0 lb

## 2015-06-15 DIAGNOSIS — E049 Nontoxic goiter, unspecified: Secondary | ICD-10-CM | POA: Diagnosis not present

## 2015-06-15 DIAGNOSIS — I10 Essential (primary) hypertension: Secondary | ICD-10-CM | POA: Diagnosis not present

## 2015-06-15 DIAGNOSIS — E038 Other specified hypothyroidism: Secondary | ICD-10-CM | POA: Diagnosis not present

## 2015-06-15 DIAGNOSIS — Z91048 Other nonmedicinal substance allergy status: Secondary | ICD-10-CM | POA: Diagnosis not present

## 2015-06-15 DIAGNOSIS — E785 Hyperlipidemia, unspecified: Secondary | ICD-10-CM

## 2015-06-15 DIAGNOSIS — Z9109 Other allergy status, other than to drugs and biological substances: Secondary | ICD-10-CM

## 2015-06-15 LAB — CBC WITH DIFFERENTIAL/PLATELET
BASOS PCT: 0 % (ref 0–1)
Basophils Absolute: 0 10*3/uL (ref 0.0–0.1)
Eosinophils Absolute: 0 10*3/uL (ref 0.0–0.7)
Eosinophils Relative: 0 % (ref 0–5)
HEMATOCRIT: 38.5 % (ref 36.0–46.0)
HEMOGLOBIN: 12.9 g/dL (ref 12.0–15.0)
Lymphocytes Relative: 25 % (ref 12–46)
Lymphs Abs: 1.7 10*3/uL (ref 0.7–4.0)
MCH: 29.6 pg (ref 26.0–34.0)
MCHC: 33.5 g/dL (ref 30.0–36.0)
MCV: 88.3 fL (ref 78.0–100.0)
MONO ABS: 0.4 10*3/uL (ref 0.1–1.0)
MONOS PCT: 6 % (ref 3–12)
MPV: 9 fL (ref 8.6–12.4)
NEUTROS ABS: 4.7 10*3/uL (ref 1.7–7.7)
Neutrophils Relative %: 69 % (ref 43–77)
Platelets: 365 10*3/uL (ref 150–400)
RBC: 4.36 MIL/uL (ref 3.87–5.11)
RDW: 13.5 % (ref 11.5–15.5)
WBC: 6.8 10*3/uL (ref 4.0–10.5)

## 2015-06-15 LAB — LIPID PANEL
CHOL/HDL RATIO: 4.5 ratio (ref <=5.0)
Cholesterol: 166 mg/dL (ref 125–200)
HDL: 37 mg/dL — ABNORMAL LOW (ref >=46)
LDL Cholesterol: 105 mg/dL (ref <130)
Triglycerides: 122 mg/dL (ref <150)
VLDL: 24 mg/dL (ref <30)

## 2015-06-15 LAB — COMPLETE METABOLIC PANEL WITH GFR
ALBUMIN: 3.9 g/dL (ref 3.6–5.1)
ALK PHOS: 71 U/L (ref 33–130)
ALT: 12 U/L (ref 6–29)
AST: 16 U/L (ref 10–35)
BUN: 16 mg/dL (ref 7–25)
CO2: 31 mmol/L (ref 20–31)
Calcium: 9.5 mg/dL (ref 8.6–10.4)
Chloride: 103 mmol/L (ref 98–110)
Creat: 0.52 mg/dL (ref 0.50–0.99)
GFR, Est African American: >89 mL/min (ref >=60)
GFR, Est Non African American: >89 mL/min (ref >=60)
GLUCOSE: 94 mg/dL (ref 65–99)
Potassium: 4.1 mmol/L (ref 3.5–5.3)
SODIUM: 141 mmol/L (ref 135–146)
TOTAL PROTEIN: 7.4 g/dL (ref 6.1–8.1)
Total Bilirubin: 0.6 mg/dL (ref 0.2–1.2)

## 2015-06-15 LAB — TSH: TSH: 0.5 u[IU]/mL (ref 0.350–4.500)

## 2015-06-15 MED ORDER — MONTELUKAST SODIUM 10 MG PO TABS: 10.0000 mg | ORAL_TABLET | Freq: Every day | ORAL | Status: DC

## 2015-06-15 MED ORDER — EZETIMIBE 10 MG PO TABS: 10.0000 mg | ORAL_TABLET | Freq: Every day | ORAL | Status: AC

## 2015-06-15 MED ORDER — KLOR-CON 10 10 MEQ PO TBCR: 20.0000 meq | EXTENDED_RELEASE_TABLET | Freq: Every day | ORAL | Status: DC

## 2015-06-15 MED ORDER — SYNTHROID 112 MCG PO TABS: 112.0000 ug | ORAL_TABLET | Freq: Every day | ORAL | Status: DC

## 2015-06-15 MED ORDER — AMLODIPINE BESYLATE 10 MG PO TABS: 10.0000 mg | ORAL_TABLET | Freq: Every day | ORAL | Status: DC

## 2015-06-15 MED ORDER — HYDROCHLOROTHIAZIDE 25 MG PO TABS: 25.0000 mg | ORAL_TABLET | Freq: Every day | ORAL | Status: DC
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
## 2015-06-15 NOTE — Telephone Encounter (Signed)
Patient is calling because the generic cholesterol medication for zetia will cost her $70 and the name brand will cost over $96.. Patient wants to know if there's a cheaper generic alternative. Please call!

## 2015-06-16 NOTE — Telephone Encounter (Signed)
There are no cheaper generics. See if she can afford to take the Zetia every other day

## 2015-06-17 NOTE — Telephone Encounter (Signed)
Spoke with patient and she said yes she can do that. That will be within her budget.

## 2015-06-18 ENCOUNTER — Other Ambulatory Visit: Payer: Self-pay | Admitting: Emergency Medicine

## 2015-06-19 ENCOUNTER — Ambulatory Visit (HOSPITAL_BASED_OUTPATIENT_CLINIC_OR_DEPARTMENT_OTHER): Payer: Medicare Other | Admitting: Nurse Practitioner

## 2015-06-19 ENCOUNTER — Encounter: Payer: Self-pay | Admitting: Nurse Practitioner

## 2015-06-19 VITALS — BP 146/64 | HR 92 | Temp 98.4°F | Resp 18 | Ht 65.0 in | Wt 148.4 lb

## 2015-06-19 DIAGNOSIS — C50411 Malignant neoplasm of upper-outer quadrant of right female breast: Secondary | ICD-10-CM

## 2015-06-19 DIAGNOSIS — Z79811 Long term (current) use of aromatase inhibitors: Secondary | ICD-10-CM

## 2015-06-19 DIAGNOSIS — Z170 Estrogen receptor positive status [ER+]: Secondary | ICD-10-CM
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
## 2015-06-21 ENCOUNTER — Ambulatory Visit: Payer: Self-pay | Admitting: Emergency Medicine

## 2015-06-25 ENCOUNTER — Ambulatory Visit (INDEPENDENT_AMBULATORY_CARE_PROVIDER_SITE_OTHER): Payer: Medicare Other | Admitting: Gynecology

## 2015-06-25 ENCOUNTER — Encounter: Payer: Self-pay | Admitting: Gynecology

## 2015-06-25 VITALS — BP 138/78 | Ht 65.0 in | Wt 149.0 lb

## 2015-06-25 DIAGNOSIS — Z01419 Encounter for gynecological examination (general) (routine) without abnormal findings: Secondary | ICD-10-CM

## 2015-06-25 DIAGNOSIS — E049 Nontoxic goiter, unspecified: Secondary | ICD-10-CM | POA: Diagnosis not present

## 2015-06-25 DIAGNOSIS — M8580 Other specified disorders of bone density and structure, unspecified site: Secondary | ICD-10-CM | POA: Diagnosis not present

## 2015-06-25 DIAGNOSIS — Z853 Personal history of malignant neoplasm of breast: Secondary | ICD-10-CM | POA: Diagnosis not present
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
## 2015-06-27 ENCOUNTER — Other Ambulatory Visit: Payer: Self-pay | Admitting: Nurse Practitioner

## 2015-06-27 DIAGNOSIS — M8580 Other specified disorders of bone density and structure, unspecified site: Secondary | ICD-10-CM

## 2015-06-27 DIAGNOSIS — Z780 Asymptomatic menopausal state: Secondary | ICD-10-CM

## 2015-06-29 ENCOUNTER — Telehealth: Payer: Self-pay | Admitting: Hematology and Oncology

## 2015-06-29 NOTE — Telephone Encounter (Signed)
PATIENT CALLED TO MOVE 7/20 F/U FROM 2:15 PM TO [id number] AM DUE TO CONFLICT - PATIENT HAS NEW TIME.

## 2015-06-29 NOTE — Telephone Encounter (Signed)
Left message for patient in regards to appointment change per 2/1 pof. Feb appointment was r/s to July and bone density was scheduled for 4/3 @ 1145 am at [place].

## 2015-07-02 ENCOUNTER — Ambulatory Visit
Admission: RE | Admit: 2015-07-02 | Discharge: 2015-07-02 | Disposition: A | Discharge status: Home / Self Care | Payer: Medicare Other | Source: Ambulatory Visit | Attending: Radiation Oncology | Admitting: Radiation Oncology

## 2015-07-02 ENCOUNTER — Encounter: Payer: Self-pay | Admitting: Radiation Oncology

## 2015-07-02 ENCOUNTER — Ambulatory Visit: Payer: Self-pay | Admitting: Radiation Oncology

## 2015-07-02 VITALS — BP 140/72 | HR 75 | Temp 97.9°F | Ht 65.0 in | Wt 150.3 lb

## 2015-07-02 DIAGNOSIS — C50411 Malignant neoplasm of upper-outer quadrant of right female breast: Secondary | ICD-10-CM

## 2015-07-02 NOTE — Progress Notes (Signed)
Ms. Lerner here for reassessment s/p SRT to her right breast.  Denies any pain.  Decreasing hyperpigmentation noted.  She reports gradual decrease in fatigue.  Arimidex started on 04/04/15.

## 2015-07-02 NOTE — Progress Notes (Signed)
Radiation Oncology         ([phone]) [phone] ________________________________  Name: [name] MRN: [id number]  Date: 07/02/2015  DOB: [birth year]  Follow-Up Visit Note  CC: Rachel Reichmann, MD  Rachel Lose, MD  Diagnosis:   right-sided breast cancer  Interval Since Last Radiation:  Approximately one month   Narrative:  The patient returns today for routine follow-up.  She has done well overall since she finished treatment. The patient's skin has healed significantly since she completed her course of radiation treatment. She has begun anti-hormonal treatment.                              [name] here for reassessment s/p SRT to her right breast.  Denies any pain.  Decreasing hyperpigmentation noted.  She reports gradual decrease in fatigue.  Arimidex started on 04/04/15.  ALLERGIES:  is allergic to contrast media and silvadene.  Meds: Current Outpatient Prescriptions  Medication Sig Dispense Refill  . albuterol (PROVENTIL HFA;VENTOLIN HFA) 108 (90 BASE) MCG/ACT inhaler Inhale into the lungs every 6 (six) hours as needed for wheezing or shortness of breath.    Marland Kitchen amLODipine (NORVASC) 10 MG tablet Take 1 tablet (10 mg total) by mouth daily. 90 tablet 3  . anastrozole (ARIMIDEX) 1 MG tablet Take 1 tablet (1 mg total) by mouth daily. 90 tablet 3  . aspirin EC 325 MG tablet Take 1 tablet (325 mg total) by mouth daily. (Patient taking differently: Take 81 mg by mouth daily. ) 30 tablet 0  . Calcium Carbonate-Vitamin D (CALCIUM + D PO) Take 2 tablets by mouth daily.     . cetirizine (ZYRTEC) 10 MG tablet Take 10 mg by mouth daily as needed for allergies.    Marland Kitchen ezetimibe (ZETIA) 10 MG tablet Take 1 tablet (10 mg total) by mouth daily. 30 tablet 11  . hydrochlorothiazide (HYDRODIURIL) 25 MG tablet Take 1 tablet (25 mg total) by mouth daily. 90 tablet 3  . KLOR-CON 10 10 MEQ tablet Take 2 tablets (20 mEq total) by mouth daily. 180 tablet 3  . mesalamine (LIALDA) 1.2 G EC tablet Take 4,800 mg  by mouth daily.     . montelukast (SINGULAIR) 10 MG tablet TAKE 1 TABLET (10 MG TOTAL) BY MOUTH AT BEDTIME. 90 tablet 0  . Multiple Vitamin (MULTIVITAMIN) tablet Take 1 tablet by mouth daily.     Marland Kitchen SYNTHROID 112 MCG tablet TAKE 1 TABLET (112 MCG TOTAL) BY MOUTH DAILY BEFORE BREAKFAST. 90 tablet 0   No current facility-administered medications for this encounter.    Physical Findings: The patient is in no acute distress. Patient is alert and oriented.  height is 5\' 5"  (1.651 m) and weight is 150 lb 4.8 oz (68.176 kg). Her temperature is 97.9 F (36.6 C). Her blood pressure is 140/72 and her pulse is 75. .   The skin in the treatment area has healed satisfactorily, no areas of concern/moist desquamation/poor healing  Lab Findings: Lab Results  Component Value Date   WBC 6.8 06/15/2015   HGB 12.9 06/15/2015   HCT 38.5 06/15/2015   MCV 88.3 06/15/2015   PLT 365 06/15/2015     Radiographic Findings: No results found.  Impression:    The patient has done satisfactorily since finishing treatment. She has begun anti-hormonal treatment.  Plan:  The patient will followup in our clinic on a when necessary basis.   [name], M.D., Ph.D.

## 2015-07-08 ENCOUNTER — Other Ambulatory Visit: Payer: Self-pay | Admitting: Emergency Medicine

## 2015-07-10 ENCOUNTER — Other Ambulatory Visit: Payer: Self-pay | Admitting: Emergency Medicine

## 2015-07-17 ENCOUNTER — Ambulatory Visit: Payer: Medicare Other | Admitting: Hematology and Oncology

## 2015-09-07 ENCOUNTER — Other Ambulatory Visit: Payer: Self-pay | Admitting: *Deleted

## 2015-10-09 DIAGNOSIS — C50411 Malignant neoplasm of upper-outer quadrant of right female breast: Secondary | ICD-10-CM | POA: Diagnosis not present

## 2015-10-18 ENCOUNTER — Ambulatory Visit (INDEPENDENT_AMBULATORY_CARE_PROVIDER_SITE_OTHER): Payer: Medicare Other | Admitting: Emergency Medicine

## 2015-10-18 ENCOUNTER — Encounter: Payer: Self-pay | Admitting: Emergency Medicine

## 2015-10-18 VITALS — BP 138/84 | HR 73 | Temp 98.1°F | Resp 16 | Ht 64.25 in | Wt 151.6 lb

## 2015-10-18 DIAGNOSIS — Z9109 Other allergy status, other than to drugs and biological substances: Secondary | ICD-10-CM

## 2015-10-18 DIAGNOSIS — E559 Vitamin D deficiency, unspecified: Secondary | ICD-10-CM

## 2015-10-18 DIAGNOSIS — I10 Essential (primary) hypertension: Secondary | ICD-10-CM | POA: Diagnosis not present

## 2015-10-18 DIAGNOSIS — Z91048 Other nonmedicinal substance allergy status: Secondary | ICD-10-CM

## 2015-10-18 DIAGNOSIS — E785 Hyperlipidemia, unspecified: Secondary | ICD-10-CM

## 2015-10-18 DIAGNOSIS — Z13820 Encounter for screening for osteoporosis: Secondary | ICD-10-CM | POA: Diagnosis not present

## 2015-10-18 DIAGNOSIS — E049 Nontoxic goiter, unspecified: Secondary | ICD-10-CM | POA: Diagnosis not present

## 2015-10-18 DIAGNOSIS — T148XXA Other injury of unspecified body region, initial encounter: Secondary | ICD-10-CM

## 2015-10-18 LAB — CBC WITH DIFFERENTIAL/PLATELET
BASOS PCT: 0 %
Basophils Absolute: 0 cells/uL (ref 0–200)
EOS PCT: 0 %
Eosinophils Absolute: 0 cells/uL — ABNORMAL LOW (ref 15–500)
HCT: 39.7 % (ref 35.0–45.0)
Hemoglobin: 13.1 g/dL (ref 11.7–15.5)
LYMPHS ABS: 1440 {cells}/uL (ref 850–3900)
LYMPHS PCT: 18 %
MCH: 29.2 pg (ref 27.0–33.0)
MCHC: 33 g/dL (ref 32.0–36.0)
MCV: 88.4 fL (ref 80.0–100.0)
MPV: 9.3 fL (ref 7.5–12.5)
Monocytes Absolute: 400 cells/uL (ref 200–950)
Monocytes Relative: 5 %
NEUTROS PCT: 77 %
Neutro Abs: 6160 cells/uL (ref 1500–7800)
Platelets: 419 10*3/uL — ABNORMAL HIGH (ref 140–400)
RBC: 4.49 MIL/uL (ref 3.80–5.10)
RDW: 13.4 % (ref 11.0–15.0)
WBC: 8 10*3/uL (ref 3.8–10.8)

## 2015-10-18 LAB — THYROID PANEL WITH TSH
FREE THYROXINE INDEX: 3.2 (ref 1.4–3.8)
T3 Uptake: 31 % (ref 22–35)
T4, Total: 10.3 ug/dL (ref 4.5–12.0)
TSH: 0.71 m[IU]/L

## 2015-10-18 LAB — LIPID PANEL
CHOLESTEROL: 182 mg/dL (ref 125–200)
HDL: 43 mg/dL — AB (ref >=46)
LDL Cholesterol: 112 mg/dL (ref <130)
TRIGLYCERIDES: 133 mg/dL (ref <150)
Total CHOL/HDL Ratio: 4.2 Ratio (ref <=5.0)
VLDL: 27 mg/dL (ref <30)
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
## 2015-10-18 NOTE — Patient Instructions (Signed)
     IF you received an x-ray today, you will receive an invoice from [facility]. Please contact [facility] at [phone] with questions or concerns regarding your invoice.   IF you received labwork today, you will receive an invoice from [facility]. Please contact [facility] at [phone] with questions or concerns regarding your invoice.   Our billing staff will not be able to assist you with questions regarding bills from these companies.  You will be contacted with the lab results as soon as they are available. The fastest way to get your results is to activate your My Chart account. Instructions are located on the last page of this paperwork. If you have not heard from us regarding the results in 2 weeks, please contact this office.      

## 2015-10-19 LAB — VITAMIN D 25 HYDROXY (VIT D DEFICIENCY, FRACTURES): Vit D, 25-Hydroxy: 36 ng/mL (ref 30–100)

## 2015-11-09 DIAGNOSIS — E049 Nontoxic goiter, unspecified: Secondary | ICD-10-CM | POA: Diagnosis not present

## 2015-11-14 DIAGNOSIS — E049 Nontoxic goiter, unspecified: Secondary | ICD-10-CM | POA: Diagnosis not present

## 2015-11-14 DIAGNOSIS — E042 Nontoxic multinodular goiter: Secondary | ICD-10-CM | POA: Diagnosis not present

## 2015-12-13 ENCOUNTER — Encounter: Payer: Self-pay | Admitting: Hematology and Oncology

## 2015-12-13 ENCOUNTER — Ambulatory Visit (HOSPITAL_BASED_OUTPATIENT_CLINIC_OR_DEPARTMENT_OTHER): Payer: Medicare Other | Admitting: Hematology and Oncology

## 2015-12-13 ENCOUNTER — Telehealth: Payer: Self-pay | Admitting: Hematology and Oncology

## 2015-12-13 VITALS — BP 140/73 | HR 8 | Temp 98.0°F | Resp 17 | Wt 153.7 lb

## 2015-12-13 DIAGNOSIS — Z79811 Long term (current) use of aromatase inhibitors: Secondary | ICD-10-CM | POA: Diagnosis not present

## 2015-12-13 DIAGNOSIS — C50411 Malignant neoplasm of upper-outer quadrant of right female breast: Secondary | ICD-10-CM

## 2015-12-13 DIAGNOSIS — Z170 Estrogen receptor positive status [ER+]: Secondary | ICD-10-CM

## 2015-12-13 NOTE — Assessment & Plan Note (Signed)
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

## 2015-12-13 NOTE — Telephone Encounter (Signed)
appt made and avs printed °

## 2015-12-13 NOTE — Progress Notes (Signed)
Patient Care Team: Darlyne Russian, MD as PCP - General (Family Medicine) Autumn Messing III, MD as Consulting Physician (General Surgery) Nicholas Lose, MD as Consulting Physician (Hematology and Oncology) Kyung Rudd, MD as Consulting Physician (Radiation Oncology) Mauro Kaufmann, RN as Registered Nurse Rockwell Germany, RN as Registered Nurse Sylvan Cheese, NP as Nurse Practitioner (Nurse Practitioner)  DIAGNOSIS: Breast cancer of upper-outer quadrant of right female breast [facility])   Staging form: Breast, AJCC 7th Edition     Clinical stage from 01/24/2015: Stage IA (T1c, N0, M0) - Unsigned     Pathologic stage from 02/07/2015: Stage IA (T1c, N0, cM0) - Signed by Enid Cutter, MD on 02/13/2015       Staging comments: Staged on final lumpectomy specimen by Dr. Donato Heinz    SUMMARY OF ONCOLOGIC HISTORY:   Breast cancer of upper-outer quadrant of right female breast ([place])   01/09/2015 Mammogram Right breast: new mass with obscured margin in UOQ, middle depth   01/12/2015 Breast US Right breast: 1.6 cm oval mass with a microbulated margin at 11:00, middle depth with posterior acoustic enhancement   01/18/2015 Initial Biopsy Right breast biopsy: Invasive ductal cancer grade 2-3 with DCIS, ER+ (100%), PR+ (100%), HER-2 negative ratio 1.75, 1.6 cm mass at 11:00 position   01/25/2015 Procedure Genetic testing BRCA plus panel Cephus Shelling) revealed no clinically significant variants at ATM, BRCA1, BRCA2, CDH1, CHEK2, PALB2, PTEN, and TP53   02/05/2015 Clinical Stage Stage IA (T1c N0)   02/05/2015 Definitive Surgery Right lumpectomy/[facility] [name]): 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 No chemotherapy.   03/23/2015 - 04/11/2015 Radiation Therapy Adjuvant RT [facility]): Right breast 42.5 Gy over 17 fractions. Right  breast seroma boost 7.5 Gy over 3 fractions. Total dose: 50 Gy   04/13/2015 -  Anti-estrogen oral therapy Anastrozole 1 mg daily. Planned duration of therapy 5 years.    06/19/2015 Survivorship Survivorship visit completed and copy of care plan provided to patient    CHIEF COMPLIANT: Follow-up on anastrozole  INTERVAL HISTORY: [name] is a 70 year old with above-mentioned history of right breast cancer currently on anastrozole therapy. She appears to be tolerating extremely well. She denies any hot flashes myalgias. She denies any lumps or nodules in the breasts.  REVIEW OF SYSTEMS:   Constitutional: Denies fevers, chills or abnormal weight loss Eyes: Denies blurriness of vision Ears, nose, mouth, throat, and face: Denies mucositis or sore throat Respiratory: Denies cough, dyspnea or wheezes Cardiovascular: Denies palpitation, chest discomfort Gastrointestinal:  Denies nausea, heartburn or change in bowel habits Skin: Denies abnormal skin rashes Lymphatics: Denies new lymphadenopathy or easy bruising Neurological:Denies numbness, tingling or new weaknesses Behavioral/Psych: Mood is stable, no new changes  Extremities: No lower extremity edema Breast:  denies any pain or lumps or nodules in either breasts All other systems were reviewed with the patient and are negative.  I have reviewed the past medical history, past surgical history, social history and family history with the patient and they are unchanged from previous note.  ALLERGIES:  is allergic to contrast media and silvadene.  MEDICATIONS:  Current Outpatient Prescriptions  Medication Sig Dispense Refill  . albuterol (PROVENTIL HFA;VENTOLIN HFA) 108 (90 BASE) MCG/ACT inhaler Inhale into the lungs every 6 (six) hours as needed for wheezing or shortness  of breath.    Marland Kitchen amLODipine (NORVASC) 10 MG tablet TAKE 1 TABLET (10 MG TOTAL) BY MOUTH DAILY. 90 tablet 0  . anastrozole (ARIMIDEX) 1 MG tablet Take 1 tablet (1 mg  total) by mouth daily. 90 tablet 3  . aspirin EC 325 MG tablet Take 1 tablet (325 mg total) by mouth daily. (Patient taking differently: Take 81 mg by mouth daily. ) 30 tablet 0  . Calcium Carbonate-Vitamin D (CALCIUM + D PO) Take 2 tablets by mouth daily.     . cetirizine (ZYRTEC) 10 MG tablet Take 10 mg by mouth daily as needed for allergies.    Marland Kitchen ezetimibe (ZETIA) 10 MG tablet Take 1 tablet (10 mg total) by mouth daily. 30 tablet 11  . hydrochlorothiazide (HYDRODIURIL) 25 MG tablet TAKE 1 TABLET (25 MG TOTAL) BY MOUTH DAILY. 90 tablet 0  . KLOR-CON 10 10 MEQ tablet TAKE 2 TABLETS (20 MEQ TOTAL) BY MOUTH DAILY. 180 tablet 0  . mesalamine (LIALDA) 1.2 G EC tablet Take 4,800 mg by mouth daily.     . montelukast (SINGULAIR) 10 MG tablet TAKE 1 TABLET (10 MG TOTAL) BY MOUTH AT BEDTIME. 90 tablet 0  . Multiple Vitamin (MULTIVITAMIN) tablet Take 1 tablet by mouth daily.     Marland Kitchen SYNTHROID 112 MCG tablet TAKE 1 TABLET (112 MCG TOTAL) BY MOUTH DAILY BEFORE BREAKFAST. 90 tablet 0   No current facility-administered medications for this visit.    PHYSICAL EXAMINATION: ECOG PERFORMANCE STATUS: 0 - Asymptomatic  Filed Vitals:   12/13/15 1137  BP: 140/73  Pulse: 8  Temp: 98 F (36.7 C)  Resp: 17   Filed Weights   12/13/15 1137  Weight: 153 lb 11.2 oz (69.718 kg)    GENERAL:alert, no distress and comfortable SKIN: skin color, texture, turgor are normal, no rashes or significant lesions EYES: normal, Conjunctiva are pink and non-injected, sclera clear OROPHARYNX:no exudate, no erythema and lips, buccal mucosa, and tongue normal  NECK: supple, thyroid normal size, non-tender, without nodularity LYMPH:  no palpable lymphadenopathy in the cervical, axillary or inguinal LUNGS: clear to auscultation and percussion with normal breathing effort HEART: regular rate & rhythm and no murmurs and no lower extremity edema ABDOMEN:abdomen soft, non-tender and normal bowel sounds MUSCULOSKELETAL:no cyanosis  of digits and no clubbing  NEURO: alert & oriented x 3 with fluent speech, no focal motor/sensory deficits EXTREMITIES: No lower extremity edema BREAST: No palpable masses or nodules in either right or left breasts. No palpable axillary supraclavicular or infraclavicular adenopathy no breast tenderness or nipple discharge. (exam performed in the presence of a chaperone)  LABORATORY DATA:  I have reviewed the data as listed   Chemistry      Component Value Date/Time   NA 141 06/15/2015 1036   NA 141 01/24/2015 1238   K 4.1 06/15/2015 1036   K 3.4* 01/24/2015 1238   CL 103 06/15/2015 1036   CO2 31 06/15/2015 1036   CO2 31* 01/24/2015 1238   BUN 16 06/15/2015 1036   BUN 16.2 01/24/2015 1238   CREATININE 0.52 06/15/2015 1036   CREATININE 0.8 01/24/2015 1238   CREATININE 0.54 03/15/2013 0410      Component Value Date/Time   CALCIUM 9.5 06/15/2015 1036   CALCIUM 10.0 01/24/2015 1238   ALKPHOS 71 06/15/2015 1036   ALKPHOS 85 01/24/2015 1238   AST 16 06/15/2015 1036   AST 15 01/24/2015 1238   ALT 12 06/15/2015 1036   ALT 12 01/24/2015 1238   BILITOT 0.6  06/15/2015 1036   BILITOT 0.43 01/24/2015 1238       Lab Results  Component Value Date   WBC 8.0 10/18/2015   HGB 13.1 10/18/2015   HCT 39.7 10/18/2015   MCV 88.4 10/18/2015   PLT 419* 10/18/2015   NEUTROABS 6160 10/18/2015     ASSESSMENT & PLAN:  Breast cancer of upper-outer quadrant of right female breast Right lumpectomy 02/05/2015: 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 In fact she is wondering if the medicine is working since she has no side effects.  Breast Cancer  Surveillance: 1. Breast exam 12/13/2015: Normal 2. Mammogram to be done August 2017 at [facility]    Return to clinic in 6 months for follow-up   No orders of the defined types were placed in this encounter.   The patient has a good understanding of the overall plan. she agrees with it. she will call with any problems that may develop before the next visit here.   Rulon Eisenmenger, MD 12/13/2015

## 2016-01-03 ENCOUNTER — Other Ambulatory Visit: Payer: Self-pay | Admitting: Emergency Medicine

## 2016-01-04 DIAGNOSIS — Z800 Family history of malignant neoplasm of digestive organs: Secondary | ICD-10-CM | POA: Diagnosis not present

## 2016-01-04 DIAGNOSIS — K5100 Ulcerative (chronic) pancolitis without complications: Secondary | ICD-10-CM | POA: Diagnosis not present

## 2016-01-10 DIAGNOSIS — Z853 Personal history of malignant neoplasm of breast: Secondary | ICD-10-CM | POA: Diagnosis not present

## 2016-01-10 DIAGNOSIS — M8589 Other specified disorders of bone density and structure, multiple sites: Secondary | ICD-10-CM | POA: Diagnosis not present

## 2016-01-10 LAB — HM MAMMOGRAPHY: HM MAMMO: ABNORMAL — AB (ref 0–4)

## 2016-02-13 ENCOUNTER — Telehealth: Payer: Self-pay

## 2016-02-13 ENCOUNTER — Other Ambulatory Visit: Payer: Self-pay

## 2016-02-13 MED ORDER — ALBUTEROL SULFATE HFA 108 (90 BASE) MCG/ACT IN AERS: 1.0000 | INHALATION_SPRAY | Freq: Four times a day (QID) | RESPIRATORY_TRACT | 3 refills | Status: AC | PRN

## 2016-02-13 NOTE — Telephone Encounter (Signed)
PATIENT WOULD LIKE DR. DAUB TO REFILL HER PROVENTIL HFA. SHE TAKES 2 PUFFS BY MOUTH EVERY 6 HOURS WHEN NEEDED. SHE SAID SHE JUST SAW DR. DAUB IN MAY AND SHE WOULD LIKE HIM TO REFILL IT BEFORE HE RETIRES. THE PHARMACIST TOLD HER IT HAD BEEN TOO LONG AND SHE WOULD HAVE TO CALL HER DOCTOR. BEST PHONE [phone] (HOME)  PHARMACY CHOICE IS [facility] ON [address].  [place]

## 2016-03-24 DIAGNOSIS — C50911 Malignant neoplasm of unspecified site of right female breast: Secondary | ICD-10-CM | POA: Diagnosis not present

## 2016-03-24 DIAGNOSIS — E039 Hypothyroidism, unspecified: Secondary | ICD-10-CM | POA: Diagnosis not present

## 2016-03-24 DIAGNOSIS — E7800 Pure hypercholesterolemia, unspecified: Secondary | ICD-10-CM | POA: Diagnosis not present

## 2016-03-24 DIAGNOSIS — K5100 Ulcerative (chronic) pancolitis without complications: Secondary | ICD-10-CM | POA: Diagnosis not present

## 2016-03-24 DIAGNOSIS — I10 Essential (primary) hypertension: Secondary | ICD-10-CM | POA: Diagnosis not present

## 2016-03-24 DIAGNOSIS — J45909 Unspecified asthma, uncomplicated: Secondary | ICD-10-CM | POA: Diagnosis not present

## 2016-03-24 DIAGNOSIS — Z23 Encounter for immunization: Secondary | ICD-10-CM | POA: Diagnosis not present

## 2016-03-31 ENCOUNTER — Other Ambulatory Visit: Payer: Self-pay | Admitting: Hematology and Oncology

## 2016-03-31 DIAGNOSIS — C50411 Malignant neoplasm of upper-outer quadrant of right female breast: Secondary | ICD-10-CM

## 2016-05-09 DIAGNOSIS — C50411 Malignant neoplasm of upper-outer quadrant of right female breast: Secondary | ICD-10-CM | POA: Diagnosis not present

## 2016-05-09 DIAGNOSIS — E039 Hypothyroidism, unspecified: Secondary | ICD-10-CM | POA: Diagnosis not present

## 2016-06-13 ENCOUNTER — Other Ambulatory Visit: Payer: Self-pay | Admitting: Emergency Medicine

## 2016-06-13 DIAGNOSIS — E038 Other specified hypothyroidism: Secondary | ICD-10-CM

## 2016-06-13 NOTE — Telephone Encounter (Signed)
09/2015 last nl tsh

## 2016-06-19 ENCOUNTER — Encounter: Payer: Self-pay | Admitting: Hematology and Oncology

## 2016-06-19 ENCOUNTER — Ambulatory Visit (HOSPITAL_BASED_OUTPATIENT_CLINIC_OR_DEPARTMENT_OTHER): Payer: 59 | Admitting: Hematology and Oncology

## 2016-06-19 DIAGNOSIS — Z170 Estrogen receptor positive status [ER+]: Secondary | ICD-10-CM

## 2016-06-19 DIAGNOSIS — Z79811 Long term (current) use of aromatase inhibitors: Secondary | ICD-10-CM

## 2016-06-19 DIAGNOSIS — C50411 Malignant neoplasm of upper-outer quadrant of right female breast: Secondary | ICD-10-CM

## 2016-06-19 NOTE — Progress Notes (Signed)
Patient Care Team: Darlyne Russian, MD as PCP - General (Family Medicine) Autumn Messing III, MD as Consulting Physician (General Surgery) Nicholas Lose, MD as Consulting Physician (Hematology and Oncology) Kyung Rudd, MD as Consulting Physician (Radiation Oncology) Mauro Kaufmann, RN as Registered Nurse Rockwell Germany, RN as Registered Nurse Sylvan Cheese, NP as Nurse Practitioner (Nurse Practitioner)  DIAGNOSIS:  Encounter Diagnosis  Name Primary?  . 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, ER+ (100%), PR+ (100%), HER-2 negative ratio 1.75, 1.6 cm mass at 11:00 position      01/25/2015 Procedure    Genetic testing BRCA plus panel Cephus Shelling) revealed no clinically significant variants at ATM, BRCA1, BRCA2, CDH1, CHEK2, PALB2, PTEN, and TP53      02/05/2015 Clinical Stage    Stage IA (T1c N0)      02/05/2015 Definitive Surgery    Right lumpectomy/[facility] [name]): Invasive ductal carcinoma 1.7 cm, grade 2, clear margins with DCIS, 0/2 lymph nodes negative,No LVI, ER+ (100%), PR+ (100%), HER-2 negative, Ki-67 30%      02/05/2015 Pathologic Stage    Stage IA: pT1c pN0       02/05/2015 Oncotype testing    RS 0 (3% ROR). No chemotherapy.      03/23/2015 - 04/11/2015 Radiation Therapy    Adjuvant RT [facility]): Right breast 42.5 Gy over 17 fractions. Right breast seroma boost 7.5 Gy over 3 fractions. Total dose: 50 Gy      04/13/2015 -  Anti-estrogen oral therapy    Anastrozole 1 mg  daily. Planned duration of therapy 5 years.       06/19/2015 Survivorship    Survivorship visit completed and copy of care plan provided to patient       CHIEF COMPLIANT: Follow-up on anastrozole therapy  INTERVAL HISTORY: [name] is a 71 year old lady with above-mentioned history of right breast cancer treated with lumpectomy and radiation and is currently on antiestrogen therapy with anastrozole. She has tolerated it extremely well. She does not have any hot flashes or myalgias. She denies any lumps or nodules in the breasts. She gets mammograms and August of the year.  REVIEW OF SYSTEMS:   Constitutional: Denies fevers, chills or abnormal weight loss Eyes: Denies blurriness of vision Ears, nose, mouth, throat, and face: Denies mucositis or sore throat Respiratory: Denies cough, dyspnea or wheezes Cardiovascular: Denies palpitation, chest discomfort Gastrointestinal:  Denies nausea, heartburn or change in bowel habits Skin: Denies abnormal skin rashes Lymphatics: Denies new lymphadenopathy or easy bruising Neurological:Denies numbness, tingling or new weaknesses Behavioral/Psych: Mood is stable, no new changes  Extremities: No lower extremity edema Breast:  denies any pain or lumps or nodules in either breasts All other systems were reviewed with the patient and are negative.  I have reviewed the past medical history, past surgical history, social history and family history with the patient and they are unchanged from previous note.  ALLERGIES:  is allergic to  contrast media [iodinated diagnostic agents] and silvadene [silver sulfadiazine].  MEDICATIONS:  Current Outpatient Prescriptions  Medication Sig Dispense Refill  . albuterol (PROVENTIL HFA;VENTOLIN HFA) 108 (90 Base) MCG/ACT inhaler Inhale 1 puff into the lungs every 6 (six) hours as needed for wheezing or shortness of breath. 1 Inhaler 3  . amLODipine (NORVASC) 10 MG tablet TAKE 1 TABLET (10 MG TOTAL) BY MOUTH  DAILY. 90 tablet 0  . anastrozole (ARIMIDEX) 1 MG tablet TAKE 1 TABLET BY MOUTH EVERY DAY 90 tablet 3  . aspirin EC 325 MG tablet Take 1 tablet (325 mg total) by mouth daily. (Patient taking differently: Take 81 mg by mouth daily. ) 30 tablet 0  . Calcium Carbonate-Vitamin D (CALCIUM + D PO) Take 2 tablets by mouth daily.     . cetirizine (ZYRTEC) 10 MG tablet Take 10 mg by mouth daily as needed for allergies.    Marland Kitchen ezetimibe (ZETIA) 10 MG tablet Take 1 tablet (10 mg total) by mouth daily. 30 tablet 11  . hydrochlorothiazide (HYDRODIURIL) 25 MG tablet TAKE 1 TABLET (25 MG TOTAL) BY MOUTH DAILY. 90 tablet 0  . mesalamine (LIALDA) 1.2 G EC tablet Take 4,800 mg by mouth daily.     . montelukast (SINGULAIR) 10 MG tablet TAKE 1 TABLET (10 MG TOTAL) BY MOUTH AT BEDTIME. 90 tablet 0  . Multiple Vitamin (MULTIVITAMIN) tablet Take 1 tablet by mouth daily.     . potassium chloride (KLOR-CON 10) 10 MEQ tablet Take 2 tablets (20 mEq total) by mouth daily. 180 tablet 2  . SYNTHROID 112 MCG tablet TAKE 1 TABLET (112 MCG TOTAL) BY MOUTH DAILY BEFORE BREAKFAST. 90 tablet 0  . SYNTHROID 112 MCG tablet TAKE 1 TABLET (112 MCG TOTAL) BY MOUTH DAILY BEFORE BREAKFAST. 90 tablet 0   No current facility-administered medications for this visit.     PHYSICAL EXAMINATION: ECOG PERFORMANCE STATUS: 0 - Asymptomatic  Vitals:   06/19/16 1135  BP: 140/80  Pulse: 80  Resp: 18  Temp: 98.1 F (36.7 C)   Filed Weights   06/19/16 1135  Weight: 154 lb 8 oz (70.1 kg)    GENERAL:alert, no distress and comfortable SKIN: skin color, texture, turgor are normal, no rashes or significant lesions EYES: normal, Conjunctiva are pink and non-injected, sclera clear OROPHARYNX:no exudate, no erythema and lips, buccal mucosa, and tongue normal  NECK: supple, thyroid normal size, non-tender, without nodularity LYMPH:  no palpable lymphadenopathy in the cervical, axillary or inguinal LUNGS: clear to auscultation and percussion with  normal breathing effort HEART: regular rate & rhythm and no murmurs and no lower extremity edema ABDOMEN:abdomen soft, non-tender and normal bowel sounds MUSCULOSKELETAL:no cyanosis of digits and no clubbing  NEURO: alert & oriented x 3 with fluent speech, no focal motor/sensory deficits EXTREMITIES: No lower extremity edema BREAST: No palpable masses or nodules in either right or left breasts. No palpable axillary supraclavicular or infraclavicular adenopathy no breast tenderness or nipple discharge. (exam performed in the presence of a chaperone)  LABORATORY DATA:  I have reviewed the data as listed   Chemistry      Component Value Date/Time   NA 141 06/15/2015 1036   NA 141 01/24/2015 1238   K 4.1 06/15/2015 1036   K 3.4 (L) 01/24/2015 1238   CL 103 06/15/2015 1036   CO2 31 06/15/2015 1036   CO2 31 (H) 01/24/2015 1238   BUN 16 06/15/2015 1036   BUN 16.2 01/24/2015 1238   CREATININE 0.52 06/15/2015 1036  CREATININE 0.8 01/24/2015 1238      Component Value Date/Time   CALCIUM 9.5 06/15/2015 1036   CALCIUM 10.0 01/24/2015 1238   ALKPHOS 71 06/15/2015 1036   ALKPHOS 85 01/24/2015 1238   AST 16 06/15/2015 1036   AST 15 01/24/2015 1238   ALT 12 06/15/2015 1036   ALT 12 01/24/2015 1238   BILITOT 0.6 06/15/2015 1036   BILITOT 0.43 01/24/2015 1238       Lab Results  Component Value Date   WBC 8.0 10/18/2015   HGB 13.1 10/18/2015   HCT 39.7 10/18/2015   MCV 88.4 10/18/2015   PLT 419 (H) 10/18/2015   NEUTROABS 6,160 10/18/2015    ASSESSMENT & PLAN:  Breast cancer of upper-outer quadrant of right female breast Right lumpectomy 02/05/2015: Invasive ductal carcinoma 1.7 cm, grade 2, clear margins with DCIS, 0/2 lymph nodes negative,No LVI, ER 100%, PR 100%, HER-2 negative, Ki-67 30%, T1 cN0 stage IA Oncotype DX score 0, 3% risk of recurrence, status post radiation completed 04/11/2015  Current treatment: Anastrozole 1 mg daily 5 years start 04/13/2015 Anastrozole  toxicities: Denies any hot flashes or myalgias.   Survivorship: Discussed the importance of physical exercise in decreasing the likelihood of breast cancer recurrence. Recommended 30 mins daily 6 days a week of either brisk walking or cycling or swimming. Encouraged patient to eat more fruits and vegetables and decrease red meat.   Breast Cancer Surveillance: 1. Breast exam January 2018: Normal 2. Mammogram done August 2017 at [facility]    Return to clinic in 1 year for follow-up   I spent 25 minutes talking to the patient of which more than half was spent in counseling and coordination of care.  No orders of the defined types were placed in this encounter.  The patient has a good understanding of the overall plan. she agrees with it. she will call with any problems that may develop before the next visit here.   Rulon Eisenmenger, MD 06/19/16

## 2016-06-19 NOTE — Assessment & Plan Note (Signed)
Right lumpectomy 02/05/2015: 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   Survivorship: Discussed the importance of physical exercise in decreasing the likelihood of breast cancer recurrence. Recommended 30 mins daily 6 days a week of either brisk walking or cycling or swimming. Encouraged patient to eat more fruits and vegetables and decrease red meat.   Breast Cancer Surveillance: 1. Breast exam January 2018: Normal 2. Mammogram done August 2017 at [place]    Return to clinic in 1 year for follow-up

## 2016-07-10 ENCOUNTER — Encounter (HOSPITAL_COMMUNITY): Payer: Self-pay

## 2016-08-05 DIAGNOSIS — L814 Other melanin hyperpigmentation: Secondary | ICD-10-CM | POA: Diagnosis not present

## 2016-08-05 DIAGNOSIS — L2089 Other atopic dermatitis: Secondary | ICD-10-CM | POA: Diagnosis not present

## 2016-08-05 DIAGNOSIS — L821 Other seborrheic keratosis: Secondary | ICD-10-CM | POA: Diagnosis not present

## 2016-08-05 DIAGNOSIS — L720 Epidermal cyst: Secondary | ICD-10-CM | POA: Diagnosis not present

## 2016-09-25 DIAGNOSIS — I10 Essential (primary) hypertension: Secondary | ICD-10-CM | POA: Diagnosis not present

## 2016-09-25 DIAGNOSIS — E7800 Pure hypercholesterolemia, unspecified: Secondary | ICD-10-CM | POA: Diagnosis not present

## 2016-09-25 DIAGNOSIS — Z0000 Encounter for general adult medical examination without abnormal findings: Secondary | ICD-10-CM | POA: Diagnosis not present

## 2016-09-25 DIAGNOSIS — E039 Hypothyroidism, unspecified: Secondary | ICD-10-CM | POA: Diagnosis not present

## 2016-09-25 DIAGNOSIS — C50911 Malignant neoplasm of unspecified site of right female breast: Secondary | ICD-10-CM | POA: Diagnosis not present

## 2016-09-25 DIAGNOSIS — M1990 Unspecified osteoarthritis, unspecified site: Secondary | ICD-10-CM | POA: Diagnosis not present

## 2016-10-08 ENCOUNTER — Encounter: Payer: Self-pay | Admitting: Gynecology

## 2016-10-09 ENCOUNTER — Encounter: Payer: Self-pay | Admitting: Podiatry

## 2016-10-09 ENCOUNTER — Ambulatory Visit (INDEPENDENT_AMBULATORY_CARE_PROVIDER_SITE_OTHER): Payer: Medicare Other

## 2016-10-09 ENCOUNTER — Ambulatory Visit (INDEPENDENT_AMBULATORY_CARE_PROVIDER_SITE_OTHER): Payer: Medicare Other | Admitting: Podiatry

## 2016-10-09 DIAGNOSIS — M778 Other enthesopathies, not elsewhere classified: Secondary | ICD-10-CM

## 2016-10-09 DIAGNOSIS — M7752 Other enthesopathy of left foot: Secondary | ICD-10-CM | POA: Diagnosis not present

## 2016-10-09 DIAGNOSIS — M79672 Pain in left foot: Secondary | ICD-10-CM

## 2016-10-09 DIAGNOSIS — M779 Enthesopathy, unspecified: Secondary | ICD-10-CM | POA: Diagnosis not present

## 2016-10-09 MED ORDER — TRIAMCINOLONE ACETONIDE 10 MG/ML IJ SUSP
10.0000 mg | Freq: Once | INTRAMUSCULAR | Status: AC
  Administered 2016-10-09: 10 mg

## 2016-10-13 NOTE — Progress Notes (Signed)
Subjective:    Patient ID: [name], female   DOB: 71 y.o.   MRN: [id number]   HPI patient presents with quite a bit of inflammation around the left plantar forefoot second MPJ with swelling and discomfort with palpation    ROS      Objective:  Physical Exam Neurovascular status intact with inflammatory capsulitis second MPJ left with pain    Assessment:   Inflammatory capsulitis second MPJ left      Plan:   Proximal nerve block obtained and I then aspirated the second MPJ getting out clear fluid and injected quarter cc deck some quarter cc of Kenalog which was tolerated well and reappoint to recheck again. Discussed possible orthotics  X-rays indicate no signs stress fracture or arthritis

## 2016-10-27 ENCOUNTER — Ambulatory Visit (INDEPENDENT_AMBULATORY_CARE_PROVIDER_SITE_OTHER): Payer: Medicare Other | Admitting: Podiatry

## 2016-10-27 ENCOUNTER — Encounter: Payer: Self-pay | Admitting: Podiatry

## 2016-10-27 DIAGNOSIS — M778 Other enthesopathies, not elsewhere classified: Secondary | ICD-10-CM

## 2016-10-27 DIAGNOSIS — M7752 Other enthesopathy of left foot: Secondary | ICD-10-CM | POA: Diagnosis not present

## 2016-10-27 DIAGNOSIS — M779 Enthesopathy, unspecified: Principal | ICD-10-CM

## 2016-10-27 NOTE — Progress Notes (Signed)
Subjective:    Patient ID: [name], female   DOB: 71 y.o.   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

## 2016-11-17 ENCOUNTER — Encounter: Payer: Medicare Other | Admitting: Orthotics

## 2016-11-21 DIAGNOSIS — E039 Hypothyroidism, unspecified: Secondary | ICD-10-CM | POA: Diagnosis not present

## 2017-01-29 ENCOUNTER — Encounter: Payer: Self-pay | Admitting: Hematology and Oncology

## 2017-01-29 DIAGNOSIS — R922 Inconclusive mammogram: Secondary | ICD-10-CM | POA: Diagnosis not present

## 2017-01-29 DIAGNOSIS — Z853 Personal history of malignant neoplasm of breast: Secondary | ICD-10-CM | POA: Diagnosis not present

## 2017-02-12 DIAGNOSIS — K5100 Ulcerative (chronic) pancolitis without complications: Secondary | ICD-10-CM | POA: Diagnosis not present

## 2017-02-12 DIAGNOSIS — Z800 Family history of malignant neoplasm of digestive organs: Secondary | ICD-10-CM | POA: Diagnosis not present

## 2017-02-24 DIAGNOSIS — Z23 Encounter for immunization: Secondary | ICD-10-CM | POA: Diagnosis not present

## 2017-03-30 ENCOUNTER — Other Ambulatory Visit: Payer: Self-pay | Admitting: Hematology and Oncology

## 2017-03-30 DIAGNOSIS — C50411 Malignant neoplasm of upper-outer quadrant of right female breast: Secondary | ICD-10-CM

## 2017-04-27 DIAGNOSIS — J45909 Unspecified asthma, uncomplicated: Secondary | ICD-10-CM | POA: Diagnosis not present

## 2017-04-27 DIAGNOSIS — E039 Hypothyroidism, unspecified: Secondary | ICD-10-CM | POA: Diagnosis not present

## 2017-04-27 DIAGNOSIS — I10 Essential (primary) hypertension: Secondary | ICD-10-CM | POA: Diagnosis not present

## 2017-04-27 DIAGNOSIS — C50911 Malignant neoplasm of unspecified site of right female breast: Secondary | ICD-10-CM | POA: Diagnosis not present

## 2017-04-27 DIAGNOSIS — K5100 Ulcerative (chronic) pancolitis without complications: Secondary | ICD-10-CM | POA: Diagnosis not present

## 2017-04-27 DIAGNOSIS — E7800 Pure hypercholesterolemia, unspecified: Secondary | ICD-10-CM | POA: Diagnosis not present

## 2017-04-27 DIAGNOSIS — J309 Allergic rhinitis, unspecified: Secondary | ICD-10-CM | POA: Diagnosis not present

## 2017-05-13 DIAGNOSIS — R928 Other abnormal and inconclusive findings on diagnostic imaging of breast: Secondary | ICD-10-CM | POA: Diagnosis not present

## 2017-05-13 DIAGNOSIS — R922 Inconclusive mammogram: Secondary | ICD-10-CM | POA: Diagnosis not present

## 2017-06-17 NOTE — Assessment & Plan Note (Signed)
Right lumpectomy 02/05/2015: 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     Breast Cancer Surveillance: 1. Breast exam January 2019: Normal 2. Mammogram done August 2018 at [place]   Return to clinic in 1 year for follow-up

## 2017-06-18 ENCOUNTER — Telehealth: Payer: Self-pay | Admitting: Hematology and Oncology

## 2017-06-18 ENCOUNTER — Inpatient Hospital Stay: Payer: Medicare Other | Attending: Hematology and Oncology | Admitting: Hematology and Oncology

## 2017-06-18 DIAGNOSIS — Z170 Estrogen receptor positive status [ER+]: Secondary | ICD-10-CM | POA: Diagnosis not present

## 2017-06-18 DIAGNOSIS — C50411 Malignant neoplasm of upper-outer quadrant of right female breast: Secondary | ICD-10-CM | POA: Insufficient documentation

## 2017-06-18 DIAGNOSIS — Z79811 Long term (current) use of aromatase inhibitors: Secondary | ICD-10-CM | POA: Diagnosis not present

## 2017-06-18 MED ORDER — ANASTROZOLE 1 MG PO TABS: 1.0000 mg | ORAL_TABLET | Freq: Every day | ORAL | 3 refills | Status: DC

## 2017-06-18 NOTE — Progress Notes (Signed)
Patient Care Team: Antony Contras, MD as PCP - General (Family Medicine) Jovita Kussmaul, MD as Consulting Physician (General Surgery) Nicholas Lose, MD as Consulting Physician (Hematology and Oncology) Kyung Rudd, MD as Consulting Physician (Radiation Oncology) Mauro Kaufmann, RN as Registered Nurse Rockwell Germany, RN as Registered Nurse Jake Shark Johny Blamer, NP as Nurse Practitioner (Nurse Practitioner)  DIAGNOSIS:  Encounter Diagnoses  Name Primary?  . Malignant neoplasm of upper-outer quadrant of right breast in female, estrogen receptor positive ([place])   . 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, ER+ (100%), PR+ (100%), HER-2 negative ratio 1.75, 1.6 cm mass at 11:00 position      01/25/2015 Procedure    Genetic testing BRCA plus panel Cephus Shelling) revealed no clinically significant variants at ATM, BRCA1, BRCA2, CDH1, CHEK2, PALB2, PTEN, and TP53      02/05/2015 Clinical Stage    Stage IA (T1c N0)      02/05/2015 Definitive Surgery    Right lumpectomy/[facility] [name]): Invasive ductal carcinoma 1.7 cm, grade 2, clear margins with DCIS, 0/2 lymph nodes negative,No LVI, ER+ (100%), PR+ (100%), HER-2 negative, Ki-67 30%      02/05/2015 Pathologic Stage    Stage IA: pT1c pN0       02/05/2015 Oncotype testing    RS 0 (3% ROR). No chemotherapy.      03/23/2015 - 04/11/2015 Radiation Therapy    Adjuvant RT [facility]): Right breast 42.5 Gy over 17 fractions. Right breast seroma boost 7.5 Gy over 3 fractions. Total dose: 50  Gy      04/13/2015 -  Anti-estrogen oral therapy    Anastrozole 1 mg daily. Planned duration of therapy 5 years.       06/19/2015 Survivorship    Survivorship visit completed and copy of care plan provided to patient       CHIEF COMPLIANT: Annual follow-up of breast cancer on anastrozole therapy  INTERVAL HISTORY: [name] is a 72 year old with above-mentioned history of right breast cancer treated with lumpectomy and is currently on anastrozole therapy.  She is tolerating it very well.  She does not have any hot flashes or arthralgias or myalgias.  She had a mammogram that showed a intramammary lymph node and she is being watched very closely with every six-month mammograms.  REVIEW OF SYSTEMS:   Constitutional: Denies fevers, chills or abnormal weight loss Eyes: Denies blurriness of vision Ears, nose, mouth, throat, and face: Denies mucositis or sore throat Respiratory: Denies cough, dyspnea or wheezes Cardiovascular: Denies palpitation, chest discomfort Gastrointestinal:  Denies nausea, heartburn or change in bowel habits Skin: Denies abnormal skin rashes Lymphatics: Denies new lymphadenopathy or easy bruising Neurological:Denies numbness, tingling or new weaknesses Behavioral/Psych: Mood is stable, no new changes  Extremities: No lower extremity edema Breast:  denies any pain or lumps or nodules in either breasts All other systems were reviewed with the patient and are negative.  I have reviewed the past medical history, past surgical history, social  history and family history with the patient and they are unchanged from previous note.  ALLERGIES:  is allergic to contrast media [iodinated diagnostic agents]; latex; lipitor [atorvastatin]; and silvadene [silver sulfadiazine].  MEDICATIONS:  Current Outpatient Medications  Medication Sig Dispense Refill  . albuterol (PROVENTIL HFA;VENTOLIN HFA) 108 (90 Base) MCG/ACT inhaler Inhale 1 puff into the lungs every 6 (six)  hours as needed for wheezing or shortness of breath. 1 Inhaler 3  . amLODipine (NORVASC) 10 MG tablet TAKE 1 TABLET (10 MG TOTAL) BY MOUTH DAILY. 90 tablet 0  . anastrozole (ARIMIDEX) 1 MG tablet Take 1 tablet (1 mg total) by mouth daily. 90 tablet 3  . aspirin EC 325 MG tablet Take 1 tablet (325 mg total) by mouth daily. (Patient taking differently: Take 81 mg by mouth daily. ) 30 tablet 0  . Calcium Carbonate-Vitamin D (CALCIUM + D PO) Take 2 tablets by mouth daily.     . cetirizine (ZYRTEC) 10 MG tablet Take 10 mg by mouth daily as needed for allergies.    Marland Kitchen ezetimibe (ZETIA) 10 MG tablet Take 1 tablet (10 mg total) by mouth daily. 30 tablet 11  . hydrochlorothiazide (HYDRODIURIL) 25 MG tablet TAKE 1 TABLET (25 MG TOTAL) BY MOUTH DAILY. 90 tablet 0  . mesalamine (LIALDA) 1.2 G EC tablet Take 4,800 mg by mouth daily.     . montelukast (SINGULAIR) 10 MG tablet TAKE 1 TABLET (10 MG TOTAL) BY MOUTH AT BEDTIME. 90 tablet 0  . Multiple Vitamin (MULTIVITAMIN) tablet Take 1 tablet by mouth daily.     . potassium chloride (KLOR-CON 10) 10 MEQ tablet Take 2 tablets (20 mEq total) by mouth daily. 180 tablet 2  . SYNTHROID 112 MCG tablet TAKE 1 TABLET (112 MCG TOTAL) BY MOUTH DAILY BEFORE BREAKFAST. 90 tablet 0   No current facility-administered medications for this visit.     PHYSICAL EXAMINATION: ECOG PERFORMANCE STATUS: 1 - Symptomatic but completely ambulatory  Vitals:   06/18/17 1136  BP: (!) 149/84  Pulse: 81  Resp: 17  Temp: 98.4 F (36.9 C)  SpO2: 100%   Filed Weights   06/18/17 1136  Weight: 151 lb 4.8 oz (68.6 kg)    GENERAL:alert, no distress and comfortable SKIN: skin color, texture, turgor are normal, no rashes or significant lesions EYES: normal, Conjunctiva are pink and non-injected, sclera clear OROPHARYNX:no exudate, no erythema and lips, buccal mucosa, and tongue normal  NECK: supple, thyroid normal size, non-tender, without nodularity LYMPH:  no palpable  lymphadenopathy in the cervical, axillary or inguinal LUNGS: clear to auscultation and percussion with normal breathing effort HEART: regular rate & rhythm and no murmurs and no lower extremity edema ABDOMEN:abdomen soft, non-tender and normal bowel sounds MUSCULOSKELETAL:no cyanosis of digits and no clubbing  NEURO: alert & oriented x 3 with fluent speech, no focal motor/sensory deficits EXTREMITIES: No lower extremity edema  LABORATORY DATA:  I have reviewed the data as listed CMP Latest Ref Rng & Units 06/15/2015 02/15/2015 01/24/2015  Glucose 65 - 99 mg/dL 94 78 97  BUN 7 - 25 mg/dL 16 18 16.2  Creatinine 0.50 - 0.99 mg/dL 0.52 0.57 0.8  Sodium 135 - 146 mmol/L 141 139 141  Potassium 3.5 - 5.3 mmol/L 4.1 3.5 3.4(L)  Chloride 98 - 110 mmol/L 103 101 -  CO2 20 - 31 mmol/L 31 30 31(H)  Calcium 8.6 - 10.4 mg/dL 9.5 9.8 10.0  Total Protein 6.1 - 8.1 g/dL 7.4 - 8.4(H)  Total Bilirubin 0.2 -  1.2 mg/dL 0.6 - 0.43  Alkaline Phos 33 - 130 U/L 71 - 85  AST 10 - 35 U/L 16 - 15  ALT 6 - 29 U/L 12 - 12    Lab Results  Component Value Date   WBC 8.0 10/18/2015   HGB 13.1 10/18/2015   HCT 39.7 10/18/2015   MCV 88.4 10/18/2015   PLT 419 (H) 10/18/2015   NEUTROABS 6,160 10/18/2015    ASSESSMENT & PLAN:  Breast cancer of upper-outer quadrant of right female breast Right lumpectomy 02/05/2015: 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    Breast Cancer Surveillance: 1. Breast exam January 2019: No palpable lumps or nodules, scar tissue from prior surgery. 2. M[facility]ogram done August 2018 [facility] appearing lymph node was noted in the right breast  measuring 7 mm and she will be followed by them in July 2019 for another evaluation.   Return to clinic in 1 year for follow-up   I spent 25 minutes talking to the patient of which more than half was spent in counseling and coordination of care.  No orders of the defined types were placed in this encounter.  The patient has a good understanding of the overall plan. she agrees with it. she will call with any problems that may develop before the next visit here.   Harriette Ohara, MD 06/18/17

## 2017-06-18 NOTE — Telephone Encounter (Signed)
Gave patient AVs and calendar of upcoming January 2020 appointments.  °

## 2017-10-26 DIAGNOSIS — E7800 Pure hypercholesterolemia, unspecified: Secondary | ICD-10-CM | POA: Diagnosis not present

## 2017-10-26 DIAGNOSIS — J309 Allergic rhinitis, unspecified: Secondary | ICD-10-CM | POA: Diagnosis not present

## 2017-10-26 DIAGNOSIS — J45909 Unspecified asthma, uncomplicated: Secondary | ICD-10-CM | POA: Diagnosis not present

## 2017-10-26 DIAGNOSIS — Z0000 Encounter for general adult medical examination without abnormal findings: Secondary | ICD-10-CM | POA: Diagnosis not present

## 2017-10-26 DIAGNOSIS — Z23 Encounter for immunization: Secondary | ICD-10-CM | POA: Diagnosis not present

## 2017-10-26 DIAGNOSIS — I10 Essential (primary) hypertension: Secondary | ICD-10-CM | POA: Diagnosis not present

## 2017-10-26 DIAGNOSIS — Z136 Encounter for screening for cardiovascular disorders: Secondary | ICD-10-CM | POA: Diagnosis not present

## 2017-10-26 DIAGNOSIS — E039 Hypothyroidism, unspecified: Secondary | ICD-10-CM | POA: Diagnosis not present

## 2017-11-13 DIAGNOSIS — M62838 Other muscle spasm: Secondary | ICD-10-CM | POA: Diagnosis not present

## 2017-11-13 DIAGNOSIS — Z853 Personal history of malignant neoplasm of breast: Secondary | ICD-10-CM | POA: Diagnosis not present

## 2017-11-13 DIAGNOSIS — R42 Dizziness and giddiness: Secondary | ICD-10-CM | POA: Diagnosis not present

## 2017-11-13 DIAGNOSIS — I10 Essential (primary) hypertension: Secondary | ICD-10-CM | POA: Diagnosis not present

## 2017-12-17 DIAGNOSIS — L219 Seborrheic dermatitis, unspecified: Secondary | ICD-10-CM | POA: Diagnosis not present

## 2017-12-17 DIAGNOSIS — J45909 Unspecified asthma, uncomplicated: Secondary | ICD-10-CM | POA: Diagnosis not present

## 2017-12-17 DIAGNOSIS — J309 Allergic rhinitis, unspecified: Secondary | ICD-10-CM | POA: Diagnosis not present

## 2017-12-17 DIAGNOSIS — E7800 Pure hypercholesterolemia, unspecified: Secondary | ICD-10-CM | POA: Diagnosis not present

## 2017-12-17 DIAGNOSIS — I10 Essential (primary) hypertension: Secondary | ICD-10-CM | POA: Diagnosis not present

## 2017-12-17 DIAGNOSIS — E039 Hypothyroidism, unspecified: Secondary | ICD-10-CM | POA: Diagnosis not present

## 2017-12-22 DIAGNOSIS — H43812 Vitreous degeneration, left eye: Secondary | ICD-10-CM | POA: Diagnosis not present

## 2017-12-22 DIAGNOSIS — H25013 Cortical age-related cataract, bilateral: Secondary | ICD-10-CM | POA: Diagnosis not present

## 2017-12-22 DIAGNOSIS — H2513 Age-related nuclear cataract, bilateral: Secondary | ICD-10-CM | POA: Diagnosis not present

## 2018-02-01 ENCOUNTER — Encounter: Payer: Self-pay | Admitting: Hematology and Oncology

## 2018-02-01 DIAGNOSIS — Z853 Personal history of malignant neoplasm of breast: Secondary | ICD-10-CM | POA: Diagnosis not present

## 2018-02-01 DIAGNOSIS — R928 Other abnormal and inconclusive findings on diagnostic imaging of breast: Secondary | ICD-10-CM | POA: Diagnosis not present

## 2018-02-01 DIAGNOSIS — M8589 Other specified disorders of bone density and structure, multiple sites: Secondary | ICD-10-CM | POA: Diagnosis not present

## 2018-02-05 ENCOUNTER — Ambulatory Visit (INDEPENDENT_AMBULATORY_CARE_PROVIDER_SITE_OTHER): Payer: Medicare Other

## 2018-02-05 ENCOUNTER — Ambulatory Visit (INDEPENDENT_AMBULATORY_CARE_PROVIDER_SITE_OTHER): Payer: Medicare Other | Admitting: Podiatry

## 2018-02-05 ENCOUNTER — Encounter: Payer: Self-pay | Admitting: Podiatry

## 2018-02-05 DIAGNOSIS — M21371 Foot drop, right foot: Secondary | ICD-10-CM | POA: Diagnosis not present

## 2018-02-05 DIAGNOSIS — R2681 Unsteadiness on feet: Secondary | ICD-10-CM

## 2018-02-05 DIAGNOSIS — M779 Enthesopathy, unspecified: Secondary | ICD-10-CM | POA: Diagnosis not present

## 2018-02-05 DIAGNOSIS — G629 Polyneuropathy, unspecified: Secondary | ICD-10-CM

## 2018-02-05 DIAGNOSIS — M778 Other enthesopathies, not elsewhere classified: Secondary | ICD-10-CM

## 2018-02-05 MED ORDER — TRIAMCINOLONE ACETONIDE 10 MG/ML IJ SUSP
10.0000 mg | Freq: Once | INTRAMUSCULAR | Status: AC
  Administered 2018-02-05: 10 mg

## 2018-02-05 NOTE — Progress Notes (Signed)
Subjective:   Patient ID: [name], female   DOB: 71 y.o.   MRN: [id number]   HPI Patient states I developed a lot of pain underneath my left foot with fluid buildup and I am having tremendous balance issues with neuropathy with several falls and I am unbalanced on my left and I do have foot drop on my right   ROS      Objective:  Physical Exam  Neurovascular status intact with inflammation around the first MPJ plantar left and patient noted to have diminishment of sharp dull vibratory bilateral with diminishment of function anterior tibial tendon right with a mild fixed equinus deformity right.  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  Due to the history of falls and cane usage I recommended balance bracing for left and I am referring patient to ped orthotist for evaluation and treatment and for the right there is a consideration for some form of AFO brace but I am concerned about the fixed equinus and I want the patient evaluated by ped orthotist.  Today injected the plantar capsule left 3 mg Kenalog 5 mg Xylocaine debrided the lesion and hopefully this will reduce the acute pain she is in  X-ray indicates that there is no signs of stress fracture or damage to the sesamoidal complex

## 2018-02-09 ENCOUNTER — Ambulatory Visit: Payer: Medicare Other | Admitting: Orthotics

## 2018-02-09 DIAGNOSIS — M21371 Foot drop, right foot: Secondary | ICD-10-CM

## 2018-02-09 DIAGNOSIS — M779 Enthesopathy, unspecified: Secondary | ICD-10-CM

## 2018-02-09 DIAGNOSIS — R2681 Unsteadiness on feet: Secondary | ICD-10-CM

## 2018-02-09 DIAGNOSIS — G629 Polyneuropathy, unspecified: Secondary | ICD-10-CM

## 2018-02-09 NOTE — Progress Notes (Addendum)
Seen patient today for evaluation for balance brace and/or foot drop brace.  Patient has approximatelyh 3/8" leg discreptency RIGHT with prominent foot drop.  She feels this is bring her out balance, which is agreed upon.  She also is concerned about hx of falling and ankle stability LEFT; however, she utilizes a CMFO LEFT that offloads her hx off capsulitis sub 1st mpj.  Therefore, there is concern if a balance brace will accommodate her CMFO which has been effective in discomfort management.   Result:  Cast her for both articulated brace RIGHT ([place]); and also for Balance Brace LEFT; however will consult with dr. Paulla Dolly and [place] if we can accomplish balance and offloading goals mutually with MBB left.   Discussed with Betha at [facility], and it agreed upon to see how balance issues are affected by [place] articulated Right before addressing left since she is fond of her CMFO

## 2018-02-15 ENCOUNTER — Telehealth: Payer: Self-pay | Admitting: Podiatry

## 2018-02-15 NOTE — Telephone Encounter (Signed)
I'm calling about the shot I got in my left foot on 13 September. I would like to get some information on the surgery that could fix the big toe. My number is [phone]. Thank you.

## 2018-02-15 NOTE — Telephone Encounter (Signed)
Pt states her foot hurt until this Saturday after the injection 02/05/2018 and she would like to speak with Dr. Paulla Dolly to discuss surgery. I offered pt and an appt to discuss surgery with Dr. Paulla Dolly and she states she would like him to call her. I told her I would give him the message and call again.

## 2018-02-17 ENCOUNTER — Ambulatory Visit (INDEPENDENT_AMBULATORY_CARE_PROVIDER_SITE_OTHER): Payer: Medicare Other | Admitting: Podiatry

## 2018-02-17 ENCOUNTER — Encounter: Payer: Self-pay | Admitting: Podiatry

## 2018-02-17 DIAGNOSIS — M21371 Foot drop, right foot: Secondary | ICD-10-CM

## 2018-02-17 DIAGNOSIS — M779 Enthesopathy, unspecified: Secondary | ICD-10-CM

## 2018-02-17 DIAGNOSIS — R2681 Unsteadiness on feet: Secondary | ICD-10-CM

## 2018-02-17 MED ORDER — TRIAMCINOLONE ACETONIDE 10 MG/ML IJ SUSP
10.0000 mg | Freq: Once | INTRAMUSCULAR | Status: AC
  Administered 2018-02-17: 10 mg

## 2018-02-17 NOTE — Telephone Encounter (Signed)
She needs to come in for me to review

## 2018-02-17 NOTE — Telephone Encounter (Signed)
Left message informing pt Dr. Paulla Dolly requested she make an appt to discuss treatment options.

## 2018-02-18 DIAGNOSIS — K5900 Constipation, unspecified: Secondary | ICD-10-CM | POA: Diagnosis not present

## 2018-02-18 DIAGNOSIS — Z800 Family history of malignant neoplasm of digestive organs: Secondary | ICD-10-CM | POA: Diagnosis not present

## 2018-02-18 DIAGNOSIS — K5100 Ulcerative (chronic) pancolitis without complications: Secondary | ICD-10-CM | POA: Diagnosis not present

## 2018-02-20 NOTE — Progress Notes (Signed)
Subjective:   Patient ID: [name], female   DOB: 72 y.o.   MRN: [id number]   HPI Patient states she is getting her brace first on the right and in the left one as far as balance bracing will be addressed with patient found to have plantar callus tissue left and inflammation now for the big toe joint on the lateral side of the joint surface   ROS      Objective:  Physical Exam  Doing better with the plantar keratotic lesion left with patient still having balance issues left and also a fixed equinus right which will be addressed with a brace that she is seen in the ped orthotist for.  I do believe she still needs balance bracing     Assessment:  Patient with fixed equinus condition right balance issues with inflammation of the first MPJ left and also keratotic lesion left     Plan:  H&P and today I did inject the joint surface left lateral side 3 Milgram Kenalog 5 Milgram Xylocaine and advised this patient on anti-inflammatories and continued shoe gear usage until we are able to get bracing for this patient

## 2018-03-09 ENCOUNTER — Ambulatory Visit: Payer: Medicare Other | Admitting: Orthotics

## 2018-03-09 DIAGNOSIS — M21371 Foot drop, right foot: Secondary | ICD-10-CM

## 2018-03-09 DIAGNOSIS — R2681 Unsteadiness on feet: Secondary | ICD-10-CM | POA: Diagnosis not present

## 2018-03-09 DIAGNOSIS — M7750 Other enthesopathy of unspecified foot: Secondary | ICD-10-CM

## 2018-03-09 DIAGNOSIS — M779 Enthesopathy, unspecified: Secondary | ICD-10-CM

## 2018-03-15 ENCOUNTER — Ambulatory Visit (INDEPENDENT_AMBULATORY_CARE_PROVIDER_SITE_OTHER): Payer: Medicare Other | Admitting: Orthopaedic Surgery

## 2018-03-15 ENCOUNTER — Encounter (INDEPENDENT_AMBULATORY_CARE_PROVIDER_SITE_OTHER): Payer: Self-pay | Admitting: Orthopaedic Surgery

## 2018-03-15 DIAGNOSIS — M25572 Pain in left ankle and joints of left foot: Secondary | ICD-10-CM

## 2018-03-15 MED ORDER — DICLOFENAC SODIUM 1 % TD GEL: 2.0000 g | Freq: Four times a day (QID) | TRANSDERMAL | 3 refills | Status: DC

## 2018-03-15 NOTE — Progress Notes (Signed)
The patient is a 72 year old who is seeing [name] for second opinion as a relates to her left foot.  She has been seen and treated appropriately by Dr. Paulla Dolly at [facility] with a bunion deformity of her left great toe.  He is placed injections around there and apparently likely periodontal callus but is gotten where she hurts enough that she wants to consider other options.  On exam she does have pain over the left great toe MTP joint and the dominant callus medial and plantar.  She still has good motion at the MTP joint itself.  This is the source of her pain.  2 views of her left foot were on the canopy system for review.  She does have bunion deformity with prominent bone at the medial metatarsal head.  The MTP joint itself of the great toe does not show significant arthritic changes.  I would like her to see Dr. Sharol Given this week if possible to evaluate her for the possibility of surgery on her foot since this is more in his area of expertise.  She understands as well.  We have an appointment to see Dr. Sharol Given tomorrow.  No x-rays are needed.

## 2018-03-16 ENCOUNTER — Encounter (INDEPENDENT_AMBULATORY_CARE_PROVIDER_SITE_OTHER): Payer: Self-pay | Admitting: Orthopedic Surgery

## 2018-03-16 ENCOUNTER — Ambulatory Visit (INDEPENDENT_AMBULATORY_CARE_PROVIDER_SITE_OTHER): Payer: Medicare Other | Admitting: Orthopedic Surgery

## 2018-03-16 VITALS — Ht 64.0 in | Wt 151.0 lb

## 2018-03-16 DIAGNOSIS — M6702 Short Achilles tendon (acquired), left ankle: Secondary | ICD-10-CM
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
## 2018-03-24 DIAGNOSIS — Z23 Encounter for immunization: Secondary | ICD-10-CM | POA: Diagnosis not present

## 2018-03-30 DIAGNOSIS — R350 Frequency of micturition: Secondary | ICD-10-CM | POA: Diagnosis not present

## 2018-03-30 DIAGNOSIS — Z6826 Body mass index (BMI) 26.0-26.9, adult: Secondary | ICD-10-CM | POA: Diagnosis not present

## 2018-03-30 DIAGNOSIS — I10 Essential (primary) hypertension: Secondary | ICD-10-CM | POA: Diagnosis not present

## 2018-03-30 DIAGNOSIS — K219 Gastro-esophageal reflux disease without esophagitis: Secondary | ICD-10-CM | POA: Diagnosis not present

## 2018-04-13 ENCOUNTER — Ambulatory Visit (INDEPENDENT_AMBULATORY_CARE_PROVIDER_SITE_OTHER): Payer: Medicare Other | Admitting: Physician Assistant

## 2018-04-13 ENCOUNTER — Encounter (INDEPENDENT_AMBULATORY_CARE_PROVIDER_SITE_OTHER): Payer: Self-pay | Admitting: Orthopedic Surgery

## 2018-04-13 VITALS — Ht 64.0 in | Wt 151.0 lb

## 2018-04-13 DIAGNOSIS — M6702 Short Achilles tendon (acquired), left ankle: Secondary | ICD-10-CM

## 2018-04-13 DIAGNOSIS — M722 Plantar fascial fibromatosis: Secondary | ICD-10-CM

## 2018-04-13 DIAGNOSIS — M24575 Contracture, left foot: Secondary | ICD-10-CM | POA: Diagnosis not present

## 2018-04-14 ENCOUNTER — Encounter (INDEPENDENT_AMBULATORY_CARE_PROVIDER_SITE_OTHER): Payer: Self-pay | Admitting: Physician Assistant

## 2018-04-14 ENCOUNTER — Ambulatory Visit (INDEPENDENT_AMBULATORY_CARE_PROVIDER_SITE_OTHER): Payer: Medicare Other | Admitting: Orthotics

## 2018-04-14 DIAGNOSIS — M21371 Foot drop, right foot: Secondary | ICD-10-CM

## 2018-04-14 DIAGNOSIS — G629 Polyneuropathy, unspecified: Secondary | ICD-10-CM

## 2018-04-14 DIAGNOSIS — R2681 Unsteadiness on feet: Secondary | ICD-10-CM
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
## 2018-04-19 ENCOUNTER — Other Ambulatory Visit: Payer: Medicare Other | Admitting: Orthotics

## 2018-04-27 DIAGNOSIS — E039 Hypothyroidism, unspecified: Secondary | ICD-10-CM | POA: Diagnosis not present

## 2018-04-27 DIAGNOSIS — J309 Allergic rhinitis, unspecified: Secondary | ICD-10-CM | POA: Diagnosis not present

## 2018-04-27 DIAGNOSIS — K5100 Ulcerative (chronic) pancolitis without complications: Secondary | ICD-10-CM | POA: Diagnosis not present

## 2018-04-27 DIAGNOSIS — I10 Essential (primary) hypertension: Secondary | ICD-10-CM | POA: Diagnosis not present

## 2018-04-27 DIAGNOSIS — E7800 Pure hypercholesterolemia, unspecified: Secondary | ICD-10-CM | POA: Diagnosis not present

## 2018-04-27 DIAGNOSIS — K5900 Constipation, unspecified: Secondary | ICD-10-CM | POA: Diagnosis not present

## 2018-04-27 DIAGNOSIS — J45909 Unspecified asthma, uncomplicated: Secondary | ICD-10-CM | POA: Diagnosis not present

## 2018-05-02 NOTE — Progress Notes (Signed)
Patient  came in to pick up [place] brace w/ dorsi assist springs.  The brace fit well and immediately patient's  gait approved.  The brace provided desired m-l stability in frontal/transverse planes and aided in dorsiflexion in saggital plane. Patient was able to don and doff brace with minimal difficulty.  Overall patient pleased with fit and functionality of brace.  

## 2018-05-02 NOTE — Progress Notes (Signed)
Patient didn't pick up [place] Right articulated; send back for modifications.

## 2018-05-24 ENCOUNTER — Encounter (INDEPENDENT_AMBULATORY_CARE_PROVIDER_SITE_OTHER): Payer: Self-pay | Admitting: Orthopedic Surgery

## 2018-05-24 ENCOUNTER — Ambulatory Visit (INDEPENDENT_AMBULATORY_CARE_PROVIDER_SITE_OTHER): Payer: Medicare Other | Admitting: Orthopedic Surgery

## 2018-05-24 VITALS — Ht 64.0 in | Wt 151.0 lb

## 2018-05-24 DIAGNOSIS — M6702 Short Achilles tendon (acquired), left ankle: Secondary | ICD-10-CM

## 2018-05-24 DIAGNOSIS — M722 Plantar fascial fibromatosis: Secondary | ICD-10-CM
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
## 2018-06-07 ENCOUNTER — Telehealth (INDEPENDENT_AMBULATORY_CARE_PROVIDER_SITE_OTHER): Payer: Self-pay | Admitting: Orthopedic Surgery

## 2018-06-07 NOTE — Telephone Encounter (Signed)
Patient called and stated she wants you to call her about getting her surgery scheduled.  Thanks.

## 2018-06-09 ENCOUNTER — Encounter (INDEPENDENT_AMBULATORY_CARE_PROVIDER_SITE_OTHER): Payer: Self-pay | Admitting: Orthopedic Surgery

## 2018-06-09 NOTE — Telephone Encounter (Signed)
I spoke with [name].  Surgery is scheduled for Tuesday 07/06/2018. All surgery information given and advised [name] that the [facility] will be contacting her in the near future with more instructions.

## 2018-06-16 NOTE — Progress Notes (Signed)
Patient Care Team: Antony Contras, MD as PCP - General (Family Medicine) Jovita Kussmaul, MD as Consulting Physician (General Surgery) Nicholas Lose, MD as Consulting Physician (Hematology and Oncology) Kyung Rudd, MD as Consulting Physician (Radiation Oncology) Mauro Kaufmann, RN as Registered Nurse Rockwell Germany, RN as Registered Nurse Jake Shark Johny Blamer, NP as Nurse Practitioner (Nurse Practitioner)  DIAGNOSIS:    ICD-10-CM   1. Malignant neoplasm of upper-outer quadrant of right breast in female, estrogen receptor positive ([place]) C50.411    Z17.0   2. 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, ER+ (100%), PR+ (100%), HER-2 negative ratio 1.75, 1.6 cm mass at 11:00 position    01/25/2015 Procedure    Genetic testing BRCA plus panel Cephus Shelling) revealed no clinically significant variants at ATM, BRCA1, BRCA2, CDH1, CHEK2, PALB2, PTEN, and TP53    02/05/2015 Clinical Stage    Stage IA (T1c N0)    02/05/2015 Definitive Surgery    Right lumpectomy/[facility] [name]): Invasive ductal carcinoma 1.7 cm, grade 2, clear margins with DCIS, 0/2 lymph nodes negative,No LVI, ER+ (100%), PR+ (100%), HER-2 negative, Ki-67 30%    02/05/2015 Pathologic Stage    Stage IA: pT1c pN0     02/05/2015 Oncotype testing    RS 0 (3% ROR). No chemotherapy.    03/23/2015 - 04/11/2015 Radiation Therapy    Adjuvant RT [facility]): Right breast 42.5 Gy over 17 fractions. Right breast seroma boost 7.5 Gy over 3 fractions.  Total dose: 50 Gy    04/13/2015 -  Anti-estrogen oral therapy    Anastrozole 1 mg daily. Planned duration of therapy 5 years.     06/19/2015 Survivorship    Survivorship visit completed and copy of care plan provided to patient     CHIEF COMPLIANT: Follow-up of anastrozole  INTERVAL HISTORY: [name] is a 73 y.o. with above-mentioned history of right breast cancer treated with lumpectomy and is currently on anastrozole therapy. I last saw the patient one year ago. Her most recent mammogram from 02/01/18 showed a 50m mass in the right breast, possibly a lymph node, of which an [place]ore[place]owed it to be benign. Her most recent bone density scan from 02/01/18 showed a T-score of -1.6. She presents to the clinic alone today and is tolerating letrozole well although she notes hair loss. She denies hot flashes. She has acid reflux recently and has lost 8lbs in the past 3 weeks. She has a thyroid nodule and will see a new physician [place]. She denies any pain or discomfort in the breast or under her arms. She uses a walker to get around. She reviewed her medication list with me.   REVIEW OF SYSTEMS:   Constitutional: Denies fevers, chills (+) abnormal weight loss, 8lbs in 3wks (+) hair loss Eyes: Denies blurriness of vision Ears, nose, mouth, throat, and face: Denies mucositis or sore throat Respiratory: Denies cough, dyspnea or wheezes Cardiovascular: Denies  palpitation, chest discomfort Gastrointestinal: Ulcerative colitis (+) acid reflux Skin: Denies abnormal skin rashes Lymphatics: Denies new lymphadenopathy or easy bruising Neurological: Denies numbness, tingling or new weaknesses; contractures of the fingers+ Behavioral/Psych: Mood is stable, no new changes  Extremities: No lower extremity edema Breast: denies any pain or lumps or nodules in either breasts All other systems were reviewed with the patient and are negative.  I have reviewed the past medical  history, past surgical history, social history and family history with the patient and they are unchanged from previous note.  ALLERGIES:  is allergic to contrast media [iodinated diagnostic agents]; latex; lipitor [atorvastatin]; and silvadene [silver sulfadiazine].  MEDICATIONS:  Current Outpatient Medications  Medication Sig Dispense Refill  . albuterol (PROVENTIL HFA;VENTOLIN HFA) 108 (90 Base) MCG/ACT inhaler Inhale 1 puff into the lungs every 6 (six) hours as needed for wheezing or shortness of breath. 1 Inhaler 3  . amLODipine (NORVASC) 10 MG tablet TAKE 1 TABLET (10 MG TOTAL) BY MOUTH DAILY. 90 tablet 0  . anastrozole (ARIMIDEX) 1 MG tablet Take 1 tablet (1 mg total) by mouth daily. 90 tablet 3  . cetirizine (ZYRTEC) 10 MG tablet Take 10 mg by mouth daily as needed for allergies.    Marland Kitchen diclofenac sodium (VOLTAREN) 1 % GEL Apply 2 g topically 4 (four) times daily. 100 g 3  . ezetimibe (ZETIA) 10 MG tablet Take 1 tablet (10 mg total) by mouth daily. 30 tablet 11  . hydrochlorothiazide (HYDRODIURIL) 25 MG tablet TAKE 1 TABLET (25 MG TOTAL) BY MOUTH DAILY. 90 tablet 0  . levothyroxine (SYNTHROID, LEVOTHROID) 100 MCG tablet TAKE 1 TABLET BY MOUTH EVERY DAY ON EMPTY STOMACH IN THE MORNING  1  . losartan (COZAAR) 50 MG tablet Take 50 mg by mouth daily.  3  . mesalamine (APRISO) 0.375 g 24 hr capsule Take 1 capsule (0.375 g total) by mouth daily.    . montelukast (SINGULAIR) 10 MG tablet TAKE 1 TABLET (10 MG TOTAL) BY MOUTH AT BEDTIME. 90 tablet 0  . Multiple Vitamin (MULTIVITAMIN) tablet Take 1 tablet by mouth daily.     Marland Kitchen omeprazole (PRILOSEC) 20 MG capsule Take 1 capsule (20 mg total) by mouth daily.    . potassium chloride (KLOR-CON 10) 10 MEQ tablet Take 2 tablets (20 mEq total) by mouth daily. 180 tablet 2  . SYNTHROID 112 MCG tablet TAKE 1 TABLET (112 MCG TOTAL) BY MOUTH DAILY BEFORE BREAKFAST. 90 tablet 0   No current facility-administered medications for this visit.     PHYSICAL  EXAMINATION: ECOG PERFORMANCE STATUS: 1 - Symptomatic but completely ambulatory  Vitals:   06/17/18 1121  BP: (!) 142/74  Pulse: 93  Resp: 17  Temp: 98.2 F (36.8 C)  SpO2: 99%   Filed Weights   06/17/18 1121  Weight: 143 lb 8 oz (65.1 kg)    GENERAL: alert, no distress and comfortable SKIN: skin color, texture, turgor are normal, no rashes or significant lesions EYES: normal, Conjunctiva are pink and non-injected, sclera clear OROPHARYNX: no exudate, no erythema and lips, buccal mucosa, and tongue normal  NECK: supple, non-tender (+) thyroid nodule LYMPH: no palpable lymphadenopathy in the cervical, axillary or inguinal LUNGS: clear to auscultation and percussion with normal breathing effort HEART: regular rate & rhythm and no murmurs and no lower extremity edema ABDOMEN: abdomen soft, non-tender and normal bowel sounds MUSCULOSKELETAL: no cyanosis of digits and no clubbing  NEURO: alert & oriented x 3 with fluent speech, no focal motor/sensory deficits EXTREMITIES:  No lower extremity edema BREAST: No palpable masses or nodules in either right or left breasts. No palpable axillary supraclavicular or infraclavicular adenopathy no breast tenderness or nipple discharge. (exam performed in the presence of a chaperone)  LABORATORY DATA:  I have reviewed the data as listed CMP Latest Ref Rng & Units 06/15/2015 02/15/2015 01/24/2015  Glucose 65 - 99 mg/dL 94 78 97  BUN 7 - 25 mg/dL 16 18 16.2  Creatinine 0.50 - 0.99 mg/dL 0.52 0.57 0.8  Sodium 135 - 146 mmol/L 141 139 141  Potassium 3.5 - 5.3 mmol/L 4.1 3.5 3.4(L)  Chloride 98 - 110 mmol/L 103 101 -  CO2 20 - 31 mmol/L 31 30 31(H)  Calcium 8.6 - 10.4 mg/dL 9.5 9.8 10.0  Total Protein 6.1 - 8.1 g/dL 7.4 - 8.4(H)  Total Bilirubin 0.2 - 1.2 mg/dL 0.6 - 0.43  Alkaline Phos 33 - 130 U/L 71 - 85  AST 10 - 35 U/L 16 - 15  ALT 6 - 29 U/L 12 - 12    Lab Results  Component Value Date   WBC 8.0 10/18/2015   HGB 13.1 10/18/2015   HCT  39.7 10/18/2015   MCV 88.4 10/18/2015   PLT 419 (H) 10/18/2015   NEUTROABS 6,160 10/18/2015    ASSESSMENT & PLAN:  Breast cancer of upper-outer quadrant of right female breast Right lumpectomy 02/05/2015: 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  Breast Cancer Surveillance: 1. Breast exam1/23/2020: No palpable lumps or nodules, scar tissue from prior surgery. 2. Mammogram done 02/01/2018 at [facility] Benign appearing lymph node was noted in the right breast measuring 7 mm, ultrasound showed benign-appearing lymph node 3.  Bone density 02/01/2018 at [facility]: T score -1.6 osteopenia encouraged calcium and vitamin D   Return to clinic in1 yearfor follow-up    No orders of the defined types were placed in this encounter.  The patient has a good understanding of the overall plan. she agrees with it. she will call with any problems that may develop before the next visit here.  Nicholas Lose, MD 06/17/2018  Julious Oka Dorshimer am acting as scribe for Dr. Nicholas Lose.  I have reviewed the above documentation for accuracy and completeness, and I agree with the above.

## 2018-06-17 ENCOUNTER — Inpatient Hospital Stay: Payer: Medicare Other | Attending: Hematology and Oncology | Admitting: Hematology and Oncology

## 2018-06-17 ENCOUNTER — Telehealth: Payer: Self-pay | Admitting: Hematology and Oncology

## 2018-06-17 DIAGNOSIS — Z79811 Long term (current) use of aromatase inhibitors: Secondary | ICD-10-CM | POA: Diagnosis not present

## 2018-06-17 DIAGNOSIS — Z79899 Other long term (current) drug therapy: Secondary | ICD-10-CM | POA: Diagnosis not present

## 2018-06-17 DIAGNOSIS — C50411 Malignant neoplasm of upper-outer quadrant of right female breast: Secondary | ICD-10-CM | POA: Diagnosis not present

## 2018-06-17 DIAGNOSIS — Z170 Estrogen receptor positive status [ER+]: Secondary | ICD-10-CM | POA: Diagnosis not present

## 2018-06-17 DIAGNOSIS — Z923 Personal history of irradiation: Secondary | ICD-10-CM | POA: Diagnosis not present

## 2018-06-17 MED ORDER — MESALAMINE ER 0.375 G PO CP24: 375.0000 mg | ORAL_CAPSULE | Freq: Every day | ORAL | Status: AC

## 2018-06-17 MED ORDER — ANASTROZOLE 1 MG PO TABS: 1.0000 mg | ORAL_TABLET | Freq: Every day | ORAL | 3 refills | Status: DC

## 2018-06-17 MED ORDER — OMEPRAZOLE 20 MG PO CPDR: 20.0000 mg | DELAYED_RELEASE_CAPSULE | Freq: Every day | ORAL | Status: AC

## 2018-06-17 NOTE — Telephone Encounter (Signed)
Patient decline avs and calendar °

## 2018-06-17 NOTE — Assessment & Plan Note (Signed)
Right lumpectomy 02/05/2015: 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  Breast Cancer Surveillance: 1. Breast exam1/23/2020: No palpable lumps or nodules, scar tissue from prior surgery. 2. Mammogram done 02/01/2018 at [facility] Benign appearing lymph node was noted in the right breast measuring 7 mm, ultrasound showed benign-appearing lymph node 3.  Bone density 02/01/2018 at [facility]: T score -1.6 osteopenia encouraged calcium and vitamin D   Return to clinic in1 yearfor follow-up

## 2018-06-29 DIAGNOSIS — E039 Hypothyroidism, unspecified: Secondary | ICD-10-CM | POA: Diagnosis not present

## 2018-06-29 DIAGNOSIS — E049 Nontoxic goiter, unspecified: Secondary | ICD-10-CM | POA: Diagnosis not present

## 2018-07-06 ENCOUNTER — Telehealth (INDEPENDENT_AMBULATORY_CARE_PROVIDER_SITE_OTHER): Payer: Self-pay | Admitting: Orthopedic Surgery

## 2018-07-06 ENCOUNTER — Other Ambulatory Visit (INDEPENDENT_AMBULATORY_CARE_PROVIDER_SITE_OTHER): Payer: Self-pay | Admitting: Orthopedic Surgery

## 2018-07-06 DIAGNOSIS — M6702 Short Achilles tendon (acquired), left ankle: Secondary | ICD-10-CM | POA: Diagnosis not present

## 2018-07-06 MED ORDER — METHOCARBAMOL 500 MG PO TABS: 500.0000 mg | ORAL_TABLET | Freq: Four times a day (QID) | ORAL | 0 refills | Status: DC | PRN

## 2018-07-06 MED ORDER — TRAMADOL HCL 50 MG PO TABS: 50.0000 mg | ORAL_TABLET | Freq: Four times a day (QID) | ORAL | 0 refills | Status: DC | PRN

## 2018-07-06 NOTE — Telephone Encounter (Signed)
Called and lm on vm to advise that rx for Robaxin has been faxed to pharm for her. To call with any questions.

## 2018-07-06 NOTE — Telephone Encounter (Signed)
rx written

## 2018-07-06 NOTE — Telephone Encounter (Signed)
Patient had surgery by Dr. Sharol Given and is now experiencing muscle spasms and wanted to know if Dr. Sharol Given will call in an RX for them.  [facility]  CB#[phone]

## 2018-07-06 NOTE — Telephone Encounter (Signed)
Pt had surgery today gastroc recession and asked to have rx for muscle spasm.

## 2018-07-15 ENCOUNTER — Encounter (INDEPENDENT_AMBULATORY_CARE_PROVIDER_SITE_OTHER): Payer: Self-pay | Admitting: Orthopedic Surgery

## 2018-07-15 ENCOUNTER — Ambulatory Visit (INDEPENDENT_AMBULATORY_CARE_PROVIDER_SITE_OTHER): Payer: Medicare Other | Admitting: Physician Assistant

## 2018-07-15 VITALS — Ht 64.0 in | Wt 143.0 lb

## 2018-07-15 DIAGNOSIS — M6702 Short Achilles tendon (acquired), left ankle: Secondary | ICD-10-CM

## 2018-07-18 ENCOUNTER — Encounter (INDEPENDENT_AMBULATORY_CARE_PROVIDER_SITE_OTHER): Payer: Self-pay | Admitting: Physician Assistant
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
## 2018-07-23 ENCOUNTER — Encounter (INDEPENDENT_AMBULATORY_CARE_PROVIDER_SITE_OTHER): Payer: Self-pay | Admitting: Physician Assistant

## 2018-07-23 ENCOUNTER — Ambulatory Visit (INDEPENDENT_AMBULATORY_CARE_PROVIDER_SITE_OTHER): Payer: Medicare Other | Admitting: Physician Assistant

## 2018-07-23 VITALS — Ht 64.0 in | Wt 143.0 lb

## 2018-07-23 DIAGNOSIS — M6702 Short Achilles tendon (acquired), left ankle: Secondary | ICD-10-CM
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
## 2018-07-29 DIAGNOSIS — E042 Nontoxic multinodular goiter: Secondary | ICD-10-CM | POA: Diagnosis not present

## 2018-07-29 DIAGNOSIS — E049 Nontoxic goiter, unspecified: Secondary | ICD-10-CM | POA: Diagnosis not present

## 2018-08-23 ENCOUNTER — Ambulatory Visit (INDEPENDENT_AMBULATORY_CARE_PROVIDER_SITE_OTHER): Payer: Medicare Other | Admitting: Orthopedic Surgery

## 2018-09-07 DIAGNOSIS — T23029A Burn of unspecified degree of unspecified single finger (nail) except thumb, initial encounter: Secondary | ICD-10-CM | POA: Diagnosis not present

## 2018-09-07 DIAGNOSIS — L03011 Cellulitis of right finger: Secondary | ICD-10-CM | POA: Diagnosis not present

## 2018-09-17 DIAGNOSIS — L03011 Cellulitis of right finger: Secondary | ICD-10-CM | POA: Diagnosis not present

## 2018-09-17 DIAGNOSIS — T23029A Burn of unspecified degree of unspecified single finger (nail) except thumb, initial encounter: Secondary | ICD-10-CM | POA: Diagnosis not present

## 2018-09-20 ENCOUNTER — Ambulatory Visit (INDEPENDENT_AMBULATORY_CARE_PROVIDER_SITE_OTHER): Payer: Medicare Other | Admitting: Orthopedic Surgery

## 2018-09-21 ENCOUNTER — Ambulatory Visit (INDEPENDENT_AMBULATORY_CARE_PROVIDER_SITE_OTHER): Payer: Medicare Other | Admitting: Orthopedic Surgery

## 2018-09-23 ENCOUNTER — Ambulatory Visit (INDEPENDENT_AMBULATORY_CARE_PROVIDER_SITE_OTHER): Payer: Medicare Other | Admitting: Physician Assistant

## 2018-09-23 ENCOUNTER — Other Ambulatory Visit: Payer: Self-pay

## 2018-09-23 ENCOUNTER — Encounter (INDEPENDENT_AMBULATORY_CARE_PROVIDER_SITE_OTHER): Payer: Self-pay | Admitting: Orthopedic Surgery

## 2018-09-23 VITALS — Ht 64.0 in | Wt 143.0 lb

## 2018-09-23 DIAGNOSIS — M6702 Short Achilles tendon (acquired), left ankle: Secondary | ICD-10-CM

## 2018-09-24 ENCOUNTER — Encounter (INDEPENDENT_AMBULATORY_CARE_PROVIDER_SITE_OTHER): Payer: Self-pay | Admitting: Physician Assistant
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
## 2018-10-22 DIAGNOSIS — J45909 Unspecified asthma, uncomplicated: Secondary | ICD-10-CM | POA: Diagnosis not present

## 2018-10-22 DIAGNOSIS — M1990 Unspecified osteoarthritis, unspecified site: Secondary | ICD-10-CM | POA: Diagnosis not present

## 2018-10-22 DIAGNOSIS — Z853 Personal history of malignant neoplasm of breast: Secondary | ICD-10-CM | POA: Diagnosis not present

## 2018-10-22 DIAGNOSIS — E039 Hypothyroidism, unspecified: Secondary | ICD-10-CM | POA: Diagnosis not present

## 2018-10-22 DIAGNOSIS — C50911 Malignant neoplasm of unspecified site of right female breast: Secondary | ICD-10-CM | POA: Diagnosis not present

## 2018-10-22 DIAGNOSIS — I10 Essential (primary) hypertension: Secondary | ICD-10-CM | POA: Diagnosis not present

## 2018-10-25 ENCOUNTER — Ambulatory Visit: Payer: Medicare Other | Admitting: Orthopedic Surgery

## 2018-11-01 DIAGNOSIS — E7800 Pure hypercholesterolemia, unspecified: Secondary | ICD-10-CM | POA: Diagnosis not present

## 2018-11-01 DIAGNOSIS — K5100 Ulcerative (chronic) pancolitis without complications: Secondary | ICD-10-CM | POA: Diagnosis not present

## 2018-11-01 DIAGNOSIS — J45909 Unspecified asthma, uncomplicated: Secondary | ICD-10-CM | POA: Diagnosis not present

## 2018-11-01 DIAGNOSIS — Z1389 Encounter for screening for other disorder: Secondary | ICD-10-CM | POA: Diagnosis not present

## 2018-11-01 DIAGNOSIS — Z1211 Encounter for screening for malignant neoplasm of colon: Secondary | ICD-10-CM | POA: Diagnosis not present

## 2018-11-01 DIAGNOSIS — E039 Hypothyroidism, unspecified: Secondary | ICD-10-CM | POA: Diagnosis not present

## 2018-11-01 DIAGNOSIS — Z0000 Encounter for general adult medical examination without abnormal findings: Secondary | ICD-10-CM | POA: Diagnosis not present

## 2018-11-01 DIAGNOSIS — C50411 Malignant neoplasm of upper-outer quadrant of right female breast: Secondary | ICD-10-CM | POA: Diagnosis not present

## 2018-11-01 DIAGNOSIS — J309 Allergic rhinitis, unspecified: Secondary | ICD-10-CM | POA: Diagnosis not present

## 2018-11-01 DIAGNOSIS — K5900 Constipation, unspecified: Secondary | ICD-10-CM | POA: Diagnosis not present

## 2018-11-01 DIAGNOSIS — I10 Essential (primary) hypertension: Secondary | ICD-10-CM | POA: Diagnosis not present

## 2018-11-01 DIAGNOSIS — M85851 Other specified disorders of bone density and structure, right thigh: Secondary | ICD-10-CM | POA: Diagnosis not present

## 2018-11-08 ENCOUNTER — Other Ambulatory Visit: Payer: Self-pay

## 2018-11-08 ENCOUNTER — Ambulatory Visit (INDEPENDENT_AMBULATORY_CARE_PROVIDER_SITE_OTHER): Payer: Medicare Other | Admitting: Orthopedic Surgery

## 2018-11-08 ENCOUNTER — Encounter: Payer: Self-pay | Admitting: Orthopedic Surgery

## 2018-11-08 VITALS — Ht 64.0 in | Wt 143.0 lb

## 2018-11-08 DIAGNOSIS — M6702 Short Achilles tendon (acquired), left ankle: Secondary | ICD-10-CM
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
## 2018-11-23 DIAGNOSIS — E7800 Pure hypercholesterolemia, unspecified: Secondary | ICD-10-CM | POA: Diagnosis not present

## 2018-11-23 DIAGNOSIS — E039 Hypothyroidism, unspecified: Secondary | ICD-10-CM | POA: Diagnosis not present

## 2018-11-23 DIAGNOSIS — M85851 Other specified disorders of bone density and structure, right thigh: Secondary | ICD-10-CM | POA: Diagnosis not present

## 2018-12-14 ENCOUNTER — Encounter: Payer: Self-pay | Admitting: Orthopedic Surgery

## 2018-12-14 ENCOUNTER — Ambulatory Visit (INDEPENDENT_AMBULATORY_CARE_PROVIDER_SITE_OTHER): Payer: Medicare Other | Admitting: Orthopedic Surgery

## 2018-12-14 VITALS — Ht 64.0 in | Wt 143.0 lb

## 2018-12-14 DIAGNOSIS — G950 Syringomyelia and syringobulbia: Secondary | ICD-10-CM | POA: Diagnosis not present

## 2018-12-14 DIAGNOSIS — M2170 Unequal limb length (acquired), unspecified site: Secondary | ICD-10-CM | POA: Diagnosis not present
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
## 2018-12-20 DIAGNOSIS — M6281 Muscle weakness (generalized): Secondary | ICD-10-CM | POA: Diagnosis not present

## 2018-12-20 DIAGNOSIS — M25562 Pain in left knee: Secondary | ICD-10-CM | POA: Diagnosis not present

## 2018-12-20 DIAGNOSIS — R262 Difficulty in walking, not elsewhere classified: Secondary | ICD-10-CM | POA: Diagnosis not present

## 2018-12-20 DIAGNOSIS — G950 Syringomyelia and syringobulbia: Secondary | ICD-10-CM | POA: Diagnosis not present

## 2018-12-27 DIAGNOSIS — R262 Difficulty in walking, not elsewhere classified: Secondary | ICD-10-CM | POA: Diagnosis not present

## 2018-12-27 DIAGNOSIS — G950 Syringomyelia and syringobulbia: Secondary | ICD-10-CM | POA: Diagnosis not present

## 2018-12-27 DIAGNOSIS — M6281 Muscle weakness (generalized): Secondary | ICD-10-CM | POA: Diagnosis not present

## 2018-12-27 DIAGNOSIS — M25562 Pain in left knee: Secondary | ICD-10-CM | POA: Diagnosis not present

## 2018-12-31 DIAGNOSIS — R262 Difficulty in walking, not elsewhere classified: Secondary | ICD-10-CM | POA: Diagnosis not present

## 2018-12-31 DIAGNOSIS — G950 Syringomyelia and syringobulbia: Secondary | ICD-10-CM | POA: Diagnosis not present

## 2018-12-31 DIAGNOSIS — M25562 Pain in left knee: Secondary | ICD-10-CM | POA: Diagnosis not present

## 2018-12-31 DIAGNOSIS — M6281 Muscle weakness (generalized): Secondary | ICD-10-CM | POA: Diagnosis not present

## 2019-01-03 DIAGNOSIS — M25562 Pain in left knee: Secondary | ICD-10-CM | POA: Diagnosis not present

## 2019-01-03 DIAGNOSIS — G950 Syringomyelia and syringobulbia: Secondary | ICD-10-CM | POA: Diagnosis not present

## 2019-01-03 DIAGNOSIS — M6281 Muscle weakness (generalized): Secondary | ICD-10-CM | POA: Diagnosis not present

## 2019-01-03 DIAGNOSIS — R262 Difficulty in walking, not elsewhere classified: Secondary | ICD-10-CM | POA: Diagnosis not present

## 2019-01-07 DIAGNOSIS — M25562 Pain in left knee: Secondary | ICD-10-CM | POA: Diagnosis not present

## 2019-01-07 DIAGNOSIS — R262 Difficulty in walking, not elsewhere classified: Secondary | ICD-10-CM | POA: Diagnosis not present

## 2019-01-07 DIAGNOSIS — M6281 Muscle weakness (generalized): Secondary | ICD-10-CM | POA: Diagnosis not present

## 2019-01-07 DIAGNOSIS — G950 Syringomyelia and syringobulbia: Secondary | ICD-10-CM | POA: Diagnosis not present

## 2019-01-10 DIAGNOSIS — R262 Difficulty in walking, not elsewhere classified: Secondary | ICD-10-CM | POA: Diagnosis not present

## 2019-01-10 DIAGNOSIS — M25562 Pain in left knee: Secondary | ICD-10-CM | POA: Diagnosis not present

## 2019-01-10 DIAGNOSIS — M6281 Muscle weakness (generalized): Secondary | ICD-10-CM | POA: Diagnosis not present

## 2019-01-10 DIAGNOSIS — G950 Syringomyelia and syringobulbia: Secondary | ICD-10-CM | POA: Diagnosis not present

## 2019-01-14 DIAGNOSIS — M25562 Pain in left knee: Secondary | ICD-10-CM | POA: Diagnosis not present

## 2019-01-14 DIAGNOSIS — R262 Difficulty in walking, not elsewhere classified: Secondary | ICD-10-CM | POA: Diagnosis not present

## 2019-01-14 DIAGNOSIS — M6281 Muscle weakness (generalized): Secondary | ICD-10-CM | POA: Diagnosis not present

## 2019-01-14 DIAGNOSIS — G950 Syringomyelia and syringobulbia: Secondary | ICD-10-CM | POA: Diagnosis not present

## 2019-01-17 ENCOUNTER — Ambulatory Visit (INDEPENDENT_AMBULATORY_CARE_PROVIDER_SITE_OTHER): Payer: Medicare Other | Admitting: Orthopedic Surgery

## 2019-01-17 ENCOUNTER — Encounter: Payer: Self-pay | Admitting: Orthopedic Surgery

## 2019-01-17 VITALS — Ht 64.0 in | Wt 143.0 lb

## 2019-01-17 DIAGNOSIS — M2170 Unequal limb length (acquired), unspecified site: Secondary | ICD-10-CM | POA: Diagnosis not present

## 2019-01-17 DIAGNOSIS — M6702 Short Achilles tendon (acquired), left ankle: Secondary | ICD-10-CM | POA: Diagnosis not present

## 2019-01-17 DIAGNOSIS — M1610 Unilateral primary osteoarthritis, unspecified hip: Secondary | ICD-10-CM
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
## 2019-01-18 DIAGNOSIS — R262 Difficulty in walking, not elsewhere classified: Secondary | ICD-10-CM | POA: Diagnosis not present

## 2019-01-18 DIAGNOSIS — M25562 Pain in left knee: Secondary | ICD-10-CM | POA: Diagnosis not present

## 2019-01-18 DIAGNOSIS — M6281 Muscle weakness (generalized): Secondary | ICD-10-CM | POA: Diagnosis not present

## 2019-01-18 DIAGNOSIS — G950 Syringomyelia and syringobulbia: Secondary | ICD-10-CM | POA: Diagnosis not present

## 2019-01-20 DIAGNOSIS — Z23 Encounter for immunization: Secondary | ICD-10-CM | POA: Diagnosis not present

## 2019-01-21 DIAGNOSIS — G950 Syringomyelia and syringobulbia: Secondary | ICD-10-CM | POA: Diagnosis not present

## 2019-01-21 DIAGNOSIS — M6281 Muscle weakness (generalized): Secondary | ICD-10-CM | POA: Diagnosis not present

## 2019-01-21 DIAGNOSIS — M25562 Pain in left knee: Secondary | ICD-10-CM | POA: Diagnosis not present

## 2019-01-21 DIAGNOSIS — R262 Difficulty in walking, not elsewhere classified: Secondary | ICD-10-CM | POA: Diagnosis not present

## 2019-01-24 ENCOUNTER — Other Ambulatory Visit: Payer: Self-pay | Admitting: Endocrinology

## 2019-01-24 DIAGNOSIS — E039 Hypothyroidism, unspecified: Secondary | ICD-10-CM

## 2019-01-24 DIAGNOSIS — R262 Difficulty in walking, not elsewhere classified: Secondary | ICD-10-CM | POA: Diagnosis not present

## 2019-01-24 DIAGNOSIS — M25562 Pain in left knee: Secondary | ICD-10-CM | POA: Diagnosis not present

## 2019-01-24 DIAGNOSIS — M6281 Muscle weakness (generalized): Secondary | ICD-10-CM | POA: Diagnosis not present

## 2019-01-24 DIAGNOSIS — G950 Syringomyelia and syringobulbia: Secondary | ICD-10-CM | POA: Diagnosis not present

## 2019-01-28 DIAGNOSIS — R262 Difficulty in walking, not elsewhere classified: Secondary | ICD-10-CM | POA: Diagnosis not present

## 2019-01-28 DIAGNOSIS — M25562 Pain in left knee: Secondary | ICD-10-CM | POA: Diagnosis not present

## 2019-01-28 DIAGNOSIS — M6281 Muscle weakness (generalized): Secondary | ICD-10-CM | POA: Diagnosis not present

## 2019-01-28 DIAGNOSIS — G950 Syringomyelia and syringobulbia: Secondary | ICD-10-CM | POA: Diagnosis not present

## 2019-02-01 DIAGNOSIS — M25562 Pain in left knee: Secondary | ICD-10-CM | POA: Diagnosis not present

## 2019-02-01 DIAGNOSIS — R262 Difficulty in walking, not elsewhere classified: Secondary | ICD-10-CM | POA: Diagnosis not present

## 2019-02-01 DIAGNOSIS — G950 Syringomyelia and syringobulbia: Secondary | ICD-10-CM | POA: Diagnosis not present

## 2019-02-01 DIAGNOSIS — M6281 Muscle weakness (generalized): Secondary | ICD-10-CM | POA: Diagnosis not present

## 2019-02-04 DIAGNOSIS — M6281 Muscle weakness (generalized): Secondary | ICD-10-CM | POA: Diagnosis not present

## 2019-02-04 DIAGNOSIS — R262 Difficulty in walking, not elsewhere classified: Secondary | ICD-10-CM | POA: Diagnosis not present

## 2019-02-04 DIAGNOSIS — M25562 Pain in left knee: Secondary | ICD-10-CM | POA: Diagnosis not present

## 2019-02-04 DIAGNOSIS — G950 Syringomyelia and syringobulbia: Secondary | ICD-10-CM | POA: Diagnosis not present

## 2019-02-08 ENCOUNTER — Encounter: Payer: Self-pay | Admitting: Hematology and Oncology

## 2019-02-08 DIAGNOSIS — Z853 Personal history of malignant neoplasm of breast: Secondary | ICD-10-CM | POA: Diagnosis not present

## 2019-02-14 ENCOUNTER — Ambulatory Visit: Payer: Medicare Other | Admitting: Orthopedic Surgery

## 2019-02-17 DIAGNOSIS — Z800 Family history of malignant neoplasm of digestive organs: Secondary | ICD-10-CM | POA: Diagnosis not present

## 2019-02-17 DIAGNOSIS — K5100 Ulcerative (chronic) pancolitis without complications: Secondary | ICD-10-CM | POA: Diagnosis not present

## 2019-06-17 DIAGNOSIS — Z1159 Encounter for screening for other viral diseases: Secondary | ICD-10-CM | POA: Diagnosis not present

## 2019-06-19 NOTE — Progress Notes (Signed)
HEMATOLOGY-ONCOLOGY DOXIMITY VISIT PROGRESS NOTE  I connected with [name] on 06/20/2019 at 11:30 AM EST by Doximity video conference and verified that I am speaking with the correct person using two identifiers.  I discussed the limitations, risks, security and privacy concerns of performing an evaluation and management service by Doximity and the availability of in person appointments.  I also discussed with the patient that there may be a patient responsible charge related to this service. The patient expressed understanding and agreed to proceed.  Patient's Location: Home Physician Location: Clinic  CHIEF COMPLIANT: Follow-up of right breast cancer anastrozole  INTERVAL HISTORY: [name] is a 74 y.o. female with above-mentioned history of right breast cancer treated with lumpectomy, radiation, and who is currently on anastrozole. She presents over Doximity today for annual follow-up.   Oncology History  Breast cancer of upper-outer quadrant of right female breast ([place])  01/09/2015 Mammogram   Right breast: new mass with obscured margin in UOQ, middle depth   01/12/2015 Breast US   Right breast: 1.6 cm oval mass with a microbulated margin at 11:00, middle depth with posterior acoustic enhancement   01/18/2015 Initial Biopsy   Right breast biopsy: Invasive ductal cancer grade 2-3 with DCIS, ER+ (100%), PR+ (100%), HER-2 negative ratio 1.75, 1.6 cm mass at 11:00 position   01/25/2015 Procedure   Genetic testing BRCA plus panel Cephus Shelling) revealed no clinically significant variants at ATM, BRCA1, BRCA2, CDH1, CHEK2, PALB2, PTEN, and TP53   02/05/2015 Clinical Stage   Stage IA (T1c N0)   02/05/2015 Definitive Surgery   Right lumpectomy/[facility] [name]): Invasive ductal carcinoma 1.7 cm, grade 2, clear margins with DCIS, 0/2 lymph nodes negative,No LVI, ER+ (100%), PR+ (100%), HER-2 negative, Ki-67 30%   02/05/2015 Pathologic Stage   Stage IA: pT1c pN0    02/05/2015 Oncotype  testing   RS 0 (3% ROR). No chemotherapy.   03/23/2015 - 04/11/2015 Radiation Therapy   Adjuvant RT [facility]): Right breast 42.5 Gy over 17 fractions. Right breast seroma boost 7.5 Gy over 3 fractions. Total dose: 50 Gy   04/13/2015 -  Anti-estrogen oral therapy   Anastrozole 1 mg daily. Planned duration of therapy 5 years.    06/19/2015 Survivorship   Survivorship visit completed and copy of care plan provided to patient     Observations/Objective:  There were no vitals filed for this visit. There is no height or weight on file to calculate BMI.  I have reviewed the data as listed CMP Latest Ref Rng & Units 06/15/2015 02/15/2015 01/24/2015  Glucose 65 - 99 mg/dL 94 78 97  BUN 7 - 25 mg/dL 16 18 16.2  Creatinine 0.50 - 0.99 mg/dL 0.52 0.57 0.8  Sodium 135 - 146 mmol/L 141 139 141  Potassium 3.5 - 5.3 mmol/L 4.1 3.5 3.4(L)  Chloride 98 - 110 mmol/L 103 101 -  CO2 20 - 31 mmol/L 31 30 31(H)  Calcium 8.6 - 10.4 mg/dL 9.5 9.8 10.0  Total Protein 6.1 - 8.1 g/dL 7.4 - 8.4(H)  Total Bilirubin 0.2 - 1.2 mg/dL 0.6 - 0.43  Alkaline Phos 33 - 130 U/L 71 - 85  AST 10 - 35 U/L 16 - 15  ALT 6 - 29 U/L 12 - 12    Lab Results  Component Value Date   WBC 8.0 10/18/2015   HGB 13.1 10/18/2015   HCT 39.7 10/18/2015   MCV 88.4 10/18/2015   PLT 419 (H) 10/18/2015   NEUTROABS 6,160 10/18/2015  Assessment Plan:  Breast cancer of upper-outer quadrant of right female breast Right lumpectomy 02/05/2015: Invasive ductal carcinoma 1.7 cm, grade 2, clear margins with DCIS, 0/2 lymph nodes negative,No LVI, ER 100%, PR 100%, HER-2 negative, Ki-67 30%, T1 cN0 stage IA Oncotype DX score 0, 3% risk of recurrence, status post radiation completed 04/11/2015  Current treatment: Anastrozole 1 mg daily 5 years start 04/13/2015   Anastrozole toxicities: Tolerating it extremely well denies arthralgias or myalgias or hot flashes.  Breast Cancer Surveillance: 1. Breast exam:No palpable lumps or  nodules, scar tissue from prior surgery. 2. Mammogram 9/15/2020doneat [facility]: Benign breast density category B 3.  Bone density 02/07/2018 at [facility]: T score -1.6 osteopenia encouraged calcium and vitamin D   Return to clinic in1 yearfor follow-up with long-term survivorship clinic with Mendel Ryder.  After that she can decide if she wants to follow-up with [place] or with her primary care physician.    I discussed the assessment and treatment plan with the patient. The patient was provided an opportunity to ask questions and all were answered. The patient agreed with the plan and demonstrated an understanding of the instructions. The patient was advised to call back or seek an in-person evaluation if the symptoms worsen or if the condition fails to improve as anticipated.   I provided 15 minutes of face-to-face Doximity time during this encounter.    Rulon Eisenmenger, MD 06/20/2019   I, Molly Dorshimer, am acting as scribe for Nicholas Lose, MD.  I have reviewed the above documentation for accuracy and completeness, and I agree with the above.

## 2019-06-20 ENCOUNTER — Inpatient Hospital Stay: Payer: Medicare Other | Attending: Hematology and Oncology | Admitting: Hematology and Oncology

## 2019-06-20 DIAGNOSIS — Z170 Estrogen receptor positive status [ER+]: Secondary | ICD-10-CM | POA: Diagnosis not present

## 2019-06-20 DIAGNOSIS — C50411 Malignant neoplasm of upper-outer quadrant of right female breast: Secondary | ICD-10-CM

## 2019-06-20 MED ORDER — ANASTROZOLE 1 MG PO TABS: 1.0000 mg | ORAL_TABLET | Freq: Every day | ORAL | 3 refills | Status: DC

## 2019-06-20 NOTE — Assessment & Plan Note (Signed)
Right lumpectomy 02/05/2015: Invasive ductal carcinoma 1.7 cm, grade 2, clear margins with DCIS, 0/2 lymph nodes negative,No LVI, ER 100%, PR 100%, HER-2 negative, Ki-67 30%, T1 cN0 stage IA Oncotype DX score 0, 3% risk of recurrence, status post radiation completed 04/11/2015  Current treatment: Anastrozole 1 mg daily 5 years start 04/13/2015 Plan to treat her for 7 years  Anastrozole toxicities: Tolerating it extremely well denies arthralgias or myalgias or hot flashes.  Breast Cancer Surveillance: 1. Breast exam1/25/2021:No palpable lumps or nodules, scar tissue from prior surgery. 2. Mammogram doneat [facility]  3.  Bone density 02/01/2018 at [facility]: T score -1.6 osteopenia encouraged calcium and vitamin D   Return to clinic in1 yearfor follow-up

## 2019-06-21 ENCOUNTER — Telehealth: Payer: Self-pay | Admitting: Adult Health

## 2019-06-21 NOTE — Telephone Encounter (Signed)
Scheduled per 1/25 los. Called and spoke with pt, confirmed 2/3 appt

## 2019-06-22 DIAGNOSIS — K5100 Ulcerative (chronic) pancolitis without complications: Secondary | ICD-10-CM | POA: Diagnosis not present

## 2019-06-22 DIAGNOSIS — Z800 Family history of malignant neoplasm of digestive organs: Secondary | ICD-10-CM | POA: Diagnosis not present

## 2019-06-22 DIAGNOSIS — K5289 Other specified noninfective gastroenteritis and colitis: Secondary | ICD-10-CM | POA: Diagnosis not present

## 2019-06-24 DIAGNOSIS — K5289 Other specified noninfective gastroenteritis and colitis: Secondary | ICD-10-CM | POA: Diagnosis not present

## 2019-06-26 ENCOUNTER — Ambulatory Visit: Payer: Medicare Other

## 2019-08-25 ENCOUNTER — Telehealth: Payer: Self-pay | Admitting: Orthopaedic Surgery

## 2019-08-25 ENCOUNTER — Other Ambulatory Visit: Payer: Self-pay | Admitting: Orthopedic Surgery

## 2019-08-25 MED ORDER — METHOCARBAMOL 500 MG PO TABS: 500.0000 mg | ORAL_TABLET | Freq: Four times a day (QID) | ORAL | 0 refills | Status: DC | PRN

## 2019-08-25 NOTE — Telephone Encounter (Signed)
Please advise 

## 2019-08-25 NOTE — Telephone Encounter (Signed)
Rx sent 

## 2019-08-25 NOTE — Telephone Encounter (Signed)
Patient called and requested a refill of methocarbamol. This patient had seen Ninfa Linden first and he had her to see Sharol Given.  Please call patient to advise of the refill. [phone]

## 2019-08-25 NOTE — Telephone Encounter (Signed)
I called patient and advised. 

## 2019-09-30 DIAGNOSIS — D485 Neoplasm of uncertain behavior of skin: Secondary | ICD-10-CM | POA: Diagnosis not present

## 2019-09-30 DIAGNOSIS — D235 Other benign neoplasm of skin of trunk: Secondary | ICD-10-CM | POA: Diagnosis not present

## 2019-09-30 DIAGNOSIS — L2089 Other atopic dermatitis: Secondary | ICD-10-CM | POA: Diagnosis not present

## 2019-09-30 DIAGNOSIS — B078 Other viral warts: Secondary | ICD-10-CM | POA: Diagnosis not present

## 2019-09-30 DIAGNOSIS — L811 Chloasma: Secondary | ICD-10-CM | POA: Diagnosis not present

## 2019-10-13 DIAGNOSIS — Z853 Personal history of malignant neoplasm of breast: Secondary | ICD-10-CM | POA: Diagnosis not present

## 2019-10-13 DIAGNOSIS — C50411 Malignant neoplasm of upper-outer quadrant of right female breast: Secondary | ICD-10-CM | POA: Diagnosis not present

## 2019-10-13 DIAGNOSIS — J45909 Unspecified asthma, uncomplicated: Secondary | ICD-10-CM | POA: Diagnosis not present

## 2019-10-13 DIAGNOSIS — C50911 Malignant neoplasm of unspecified site of right female breast: Secondary | ICD-10-CM | POA: Diagnosis not present

## 2019-10-13 DIAGNOSIS — M1990 Unspecified osteoarthritis, unspecified site: Secondary | ICD-10-CM | POA: Diagnosis not present

## 2019-10-13 DIAGNOSIS — E039 Hypothyroidism, unspecified: Secondary | ICD-10-CM | POA: Diagnosis not present

## 2019-10-13 DIAGNOSIS — I10 Essential (primary) hypertension: Secondary | ICD-10-CM | POA: Diagnosis not present

## 2019-10-13 DIAGNOSIS — E7800 Pure hypercholesterolemia, unspecified: Secondary | ICD-10-CM | POA: Diagnosis not present

## 2019-11-04 DIAGNOSIS — J309 Allergic rhinitis, unspecified: Secondary | ICD-10-CM | POA: Diagnosis not present

## 2019-11-04 DIAGNOSIS — J45909 Unspecified asthma, uncomplicated: Secondary | ICD-10-CM | POA: Diagnosis not present

## 2019-11-04 DIAGNOSIS — Z0000 Encounter for general adult medical examination without abnormal findings: Secondary | ICD-10-CM | POA: Diagnosis not present

## 2019-11-04 DIAGNOSIS — M25562 Pain in left knee: Secondary | ICD-10-CM | POA: Diagnosis not present

## 2019-11-04 DIAGNOSIS — E039 Hypothyroidism, unspecified: Secondary | ICD-10-CM | POA: Diagnosis not present

## 2019-11-04 DIAGNOSIS — K5100 Ulcerative (chronic) pancolitis without complications: Secondary | ICD-10-CM | POA: Diagnosis not present

## 2019-11-04 DIAGNOSIS — I10 Essential (primary) hypertension: Secondary | ICD-10-CM | POA: Diagnosis not present

## 2019-11-04 DIAGNOSIS — Z1389 Encounter for screening for other disorder: Secondary | ICD-10-CM | POA: Diagnosis not present

## 2019-11-04 DIAGNOSIS — M85851 Other specified disorders of bone density and structure, right thigh: Secondary | ICD-10-CM | POA: Diagnosis not present

## 2019-11-04 DIAGNOSIS — K5900 Constipation, unspecified: Secondary | ICD-10-CM | POA: Diagnosis not present

## 2019-11-04 DIAGNOSIS — C50411 Malignant neoplasm of upper-outer quadrant of right female breast: Secondary | ICD-10-CM | POA: Diagnosis not present

## 2019-11-04 DIAGNOSIS — E7800 Pure hypercholesterolemia, unspecified: Secondary | ICD-10-CM | POA: Diagnosis not present

## 2020-02-09 DIAGNOSIS — Z853 Personal history of malignant neoplasm of breast: Secondary | ICD-10-CM | POA: Diagnosis not present

## 2020-02-09 DIAGNOSIS — R928 Other abnormal and inconclusive findings on diagnostic imaging of breast: Secondary | ICD-10-CM | POA: Diagnosis not present

## 2020-02-19 DIAGNOSIS — Z23 Encounter for immunization: Secondary | ICD-10-CM | POA: Diagnosis not present

## 2020-02-22 DIAGNOSIS — R32 Unspecified urinary incontinence: Secondary | ICD-10-CM | POA: Diagnosis not present

## 2020-03-02 DIAGNOSIS — R32 Unspecified urinary incontinence: Secondary | ICD-10-CM | POA: Diagnosis not present

## 2020-03-02 DIAGNOSIS — Z23 Encounter for immunization: Secondary | ICD-10-CM | POA: Diagnosis not present

## 2020-03-09 DIAGNOSIS — C50411 Malignant neoplasm of upper-outer quadrant of right female breast: Secondary | ICD-10-CM | POA: Diagnosis not present

## 2020-03-09 DIAGNOSIS — Z853 Personal history of malignant neoplasm of breast: Secondary | ICD-10-CM | POA: Diagnosis not present

## 2020-03-09 DIAGNOSIS — M1990 Unspecified osteoarthritis, unspecified site: Secondary | ICD-10-CM | POA: Diagnosis not present

## 2020-03-09 DIAGNOSIS — I10 Essential (primary) hypertension: Secondary | ICD-10-CM | POA: Diagnosis not present

## 2020-03-09 DIAGNOSIS — E7800 Pure hypercholesterolemia, unspecified: Secondary | ICD-10-CM | POA: Diagnosis not present

## 2020-03-09 DIAGNOSIS — E039 Hypothyroidism, unspecified: Secondary | ICD-10-CM | POA: Diagnosis not present

## 2020-03-09 DIAGNOSIS — C50911 Malignant neoplasm of unspecified site of right female breast: Secondary | ICD-10-CM | POA: Diagnosis not present

## 2020-03-09 DIAGNOSIS — J45909 Unspecified asthma, uncomplicated: Secondary | ICD-10-CM | POA: Diagnosis not present

## 2020-05-10 DIAGNOSIS — J45909 Unspecified asthma, uncomplicated: Secondary | ICD-10-CM | POA: Diagnosis not present

## 2020-05-10 DIAGNOSIS — E039 Hypothyroidism, unspecified: Secondary | ICD-10-CM | POA: Diagnosis not present

## 2020-05-10 DIAGNOSIS — M85851 Other specified disorders of bone density and structure, right thigh: Secondary | ICD-10-CM | POA: Diagnosis not present

## 2020-05-10 DIAGNOSIS — K5900 Constipation, unspecified: Secondary | ICD-10-CM | POA: Diagnosis not present

## 2020-05-10 DIAGNOSIS — E7800 Pure hypercholesterolemia, unspecified: Secondary | ICD-10-CM | POA: Diagnosis not present

## 2020-05-10 DIAGNOSIS — K5100 Ulcerative (chronic) pancolitis without complications: Secondary | ICD-10-CM | POA: Diagnosis not present

## 2020-05-10 DIAGNOSIS — C50411 Malignant neoplasm of upper-outer quadrant of right female breast: Secondary | ICD-10-CM | POA: Diagnosis not present

## 2020-05-10 DIAGNOSIS — J309 Allergic rhinitis, unspecified: Secondary | ICD-10-CM | POA: Diagnosis not present

## 2020-05-10 DIAGNOSIS — I10 Essential (primary) hypertension: Secondary | ICD-10-CM | POA: Diagnosis not present

## 2020-05-23 DIAGNOSIS — C50411 Malignant neoplasm of upper-outer quadrant of right female breast: Secondary | ICD-10-CM | POA: Diagnosis not present

## 2020-05-23 DIAGNOSIS — C50911 Malignant neoplasm of unspecified site of right female breast: Secondary | ICD-10-CM | POA: Diagnosis not present

## 2020-05-23 DIAGNOSIS — E039 Hypothyroidism, unspecified: Secondary | ICD-10-CM | POA: Diagnosis not present

## 2020-05-23 DIAGNOSIS — Z853 Personal history of malignant neoplasm of breast: Secondary | ICD-10-CM | POA: Diagnosis not present

## 2020-05-23 DIAGNOSIS — I10 Essential (primary) hypertension: Secondary | ICD-10-CM | POA: Diagnosis not present

## 2020-05-23 DIAGNOSIS — M1990 Unspecified osteoarthritis, unspecified site: Secondary | ICD-10-CM | POA: Diagnosis not present

## 2020-05-23 DIAGNOSIS — J45909 Unspecified asthma, uncomplicated: Secondary | ICD-10-CM | POA: Diagnosis not present

## 2020-05-23 DIAGNOSIS — E7800 Pure hypercholesterolemia, unspecified: Secondary | ICD-10-CM | POA: Diagnosis not present
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
## 2020-06-28 ENCOUNTER — Inpatient Hospital Stay: Payer: Medicare Other | Attending: Adult Health | Admitting: Adult Health

## 2020-06-28 DIAGNOSIS — Z170 Estrogen receptor positive status [ER+]: Secondary | ICD-10-CM

## 2020-06-28 DIAGNOSIS — C50411 Malignant neoplasm of upper-outer quadrant of right female breast: Secondary | ICD-10-CM

## 2020-06-30 ENCOUNTER — Other Ambulatory Visit: Payer: Self-pay | Admitting: Hematology and Oncology

## 2020-06-30 DIAGNOSIS — C50411 Malignant neoplasm of upper-outer quadrant of right female breast: Secondary | ICD-10-CM

## 2020-07-06 ENCOUNTER — Other Ambulatory Visit: Payer: Self-pay | Admitting: Endocrinology

## 2020-07-06 DIAGNOSIS — E049 Nontoxic goiter, unspecified: Secondary | ICD-10-CM

## 2020-07-17 ENCOUNTER — Telehealth: Payer: Self-pay | Admitting: Family Medicine

## 2020-07-19 ENCOUNTER — Other Ambulatory Visit: Payer: Self-pay | Admitting: Endocrinology

## 2020-07-19 DIAGNOSIS — E041 Nontoxic single thyroid nodule: Secondary | ICD-10-CM

## 2020-07-31 NOTE — Progress Notes (Signed)
HEMATOLOGY-ONCOLOGY TELEPHONE VISIT PROGRESS NOTE  I connected with Alvia Grove on 08/01/2020 at 11:15 AM EST by telephone and verified that I am speaking with the correct person using two identifiers.  I discussed the limitations, risks, security and privacy concerns of performing an evaluation and management service by telephone and the availability of in person appointments.  I also discussed with the patient that there may be a patient responsible charge related to this service. The patient expressed understanding and agreed to proceed.   History of Present Illness: [name] is a 75 y.o. female with above-mentioned history of right breast cancer treated with lumpectomy, radiation, and who  completed 5 years of anastrozole in 2021. She presents over the phone today for annual follow-up.   She has been off anastrozole therapy and is doing extremely well.  In September 2021 her mammograms came back excellent.  She denies any pain lumps or nodules in the breast.  Oncology History  Breast cancer of upper-outer quadrant of right female breast ([place])  01/09/2015 Mammogram   Right breast: new mass with obscured margin in UOQ, middle depth   01/12/2015 Breast US   Right breast: 1.6 cm oval mass with a microbulated margin at 11:00, middle depth with posterior acoustic enhancement   01/18/2015 Initial Biopsy   Right breast biopsy: Invasive ductal cancer grade 2-3 with DCIS, ER+ (100%), PR+ (100%), HER-2 negative ratio 1.75, 1.6 cm mass at 11:00 position   01/25/2015 Procedure   Genetic testing BRCA plus panel Cephus Shelling) revealed no clinically significant variants at ATM, BRCA1, BRCA2, CDH1, CHEK2, PALB2, PTEN, and TP53   02/05/2015 Clinical Stage   Stage IA (T1c N0)   02/05/2015 Definitive Surgery   Right lumpectomy/[name]): Invasive ductal carcinoma 1.7 cm, grade 2, clear margins with DCIS, 0/2 lymph nodes negative,No LVI, ER+ (100%), PR+ (100%), HER-2 negative, Ki-67 30%   02/05/2015  Pathologic Stage   Stage IA: pT1c pN0    02/05/2015 Oncotype testing   RS 0 (3% ROR). No chemotherapy.   03/23/2015 - 04/11/2015 Radiation Therapy   Adjuvant RT [facility]): Right breast 42.5 Gy over 17 fractions. Right breast seroma boost 7.5 Gy over 3 fractions. Total dose: 50 Gy   04/13/2015 - 03/2020 Anti-estrogen oral therapy   Anastrozole 1 mg daily. Planned duration of therapy 5 years.    06/19/2015 Survivorship   Survivorship visit completed and copy of care plan provided to patient     Observations/Objective:    Assessment Plan:  Breast cancer of upper-outer quadrant of right female breast Right lumpectomy 02/05/2015: Invasive ductal carcinoma 1.7 cm, grade 2, clear margins with DCIS, 0/2 lymph nodes negative,No LVI, ER 100%, PR 100%, HER-2 negative, Ki-67 30%, T1 cN0 stage IA Oncotype DX score 0, 3% risk of recurrence, status post radiation completed 04/11/2015  Current treatment: Anastrozole 1 mg daily 5 years start 04/13/2015- 2021    Breast Cancer Surveillance: 1. Mammogram  02/09/2020 doneat [facility]: Benign breast density category B 2.Bone density 02/07/2018 at [facility]: T score -1.6 osteopenia encouraged calcium and vitamin D  Return to clinic on as-needed basis.  She will follow with her primary care physician for mammograms.    I discussed the assessment and treatment plan with the patient. The patient was provided an opportunity to ask questions and all were answered. The patient agreed with the plan and demonstrated an understanding of the instructions. The patient was advised to call back or seek an in-person evaluation if the symptoms worsen or if the  condition fails to improve as anticipated.   Total time spent: 20 mins including non-face to face time and time spent for planning, charting and coordination of care  Rulon Eisenmenger, MD 08/01/2020    I, Cloyde Reams Dorshimer, am acting as scribe for Nicholas Lose, MD.  I have reviewed the above documentation for  accuracy and completeness, and I agree with the above.

## 2020-08-01 ENCOUNTER — Inpatient Hospital Stay: Payer: Medicare Other | Attending: Hematology and Oncology | Admitting: Hematology and Oncology

## 2020-08-01 DIAGNOSIS — C50411 Malignant neoplasm of upper-outer quadrant of right female breast: Secondary | ICD-10-CM

## 2020-08-01 DIAGNOSIS — Z170 Estrogen receptor positive status [ER+]: Secondary | ICD-10-CM

## 2020-08-01 NOTE — Assessment & Plan Note (Signed)
Right lumpectomy 02/05/2015: Invasive ductal carcinoma 1.7 cm, grade 2, clear margins with DCIS, 0/2 lymph nodes negative,No LVI, ER 100%, PR 100%, HER-2 negative, Ki-67 30%, T1 cN0 stage IA Oncotype DX score 0, 3% risk of recurrence, status post radiation completed 04/11/2015  Current treatment: Anastrozole 1 mg daily 5 years start 04/13/2015- 2021    Breast Cancer Surveillance: 1. Mammogram  02/09/2020 doneat [facility]: Benign breast density category B 2.Bone density 02/07/2018 at [facility]: T score -1.6 osteopenia encouraged calcium and vitamin D  Return to clinic on as-needed basis.  She will follow with her primary care physician for mammograms.

## 2020-08-24 ENCOUNTER — Ambulatory Visit
Admission: RE | Admit: 2020-08-24 | Discharge: 2020-08-24 | Disposition: A | Discharge status: Home / Self Care | Payer: Self-pay | Source: Ambulatory Visit | Attending: Endocrinology | Admitting: Endocrinology

## 2020-08-24 ENCOUNTER — Other Ambulatory Visit: Payer: Self-pay | Admitting: Endocrinology

## 2020-08-24 DIAGNOSIS — E041 Nontoxic single thyroid nodule: Secondary | ICD-10-CM

## 2020-09-07 ENCOUNTER — Ambulatory Visit
Admission: RE | Admit: 2020-09-07 | Discharge: 2020-09-07 | Disposition: A | Discharge status: Home / Self Care | Payer: Medicare Other | Source: Ambulatory Visit | Attending: Endocrinology | Admitting: Endocrinology

## 2020-09-07 ENCOUNTER — Other Ambulatory Visit (HOSPITAL_COMMUNITY)
Admission: RE | Admit: 2020-09-07 | Discharge: 2020-09-07 | Disposition: A | Discharge status: Home / Self Care | Payer: Medicare Other | Source: Ambulatory Visit | Attending: Physician Assistant | Admitting: Physician Assistant

## 2020-09-07 DIAGNOSIS — E041 Nontoxic single thyroid nodule: Secondary | ICD-10-CM | POA: Diagnosis present

## 2020-09-12 LAB — CYTOLOGY - NON PAP

## 2020-09-25 ENCOUNTER — Encounter (HOSPITAL_COMMUNITY): Payer: Self-pay

## 2020-10-23 ENCOUNTER — Ambulatory Visit: Payer: Medicare Other | Admitting: Orthopedic Surgery

## 2020-10-23 DIAGNOSIS — M25562 Pain in left knee: Secondary | ICD-10-CM

## 2020-11-13 ENCOUNTER — Encounter: Payer: Self-pay | Admitting: Orthopedic Surgery

## 2020-11-13 DIAGNOSIS — M25562 Pain in left knee: Secondary | ICD-10-CM

## 2020-11-13 MED ORDER — LIDOCAINE HCL (PF) 1 % IJ SOLN
5.0000 mL | INTRAMUSCULAR | Status: AC | PRN
  Administered 2020-11-13: 5 mL

## 2020-11-13 MED ORDER — METHYLPREDNISOLONE ACETATE 40 MG/ML IJ SUSP
40.0000 mg | INTRAMUSCULAR | Status: AC | PRN
  Administered 2020-11-13: 40 mg via INTRA_ARTICULAR
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
## 2021-02-20 ENCOUNTER — Telehealth: Payer: Self-pay | Admitting: *Deleted

## 2021-02-20 NOTE — Telephone Encounter (Signed)
Per MD request RN attempt x1 to contact pt regarding recent bone density report shoing T-score -1.3 which has improved since previous bone density report.  No answer, LVM to return call to the office.

## 2021-07-01 ENCOUNTER — Other Ambulatory Visit: Payer: Self-pay | Admitting: Endocrinology

## 2021-07-01 DIAGNOSIS — E039 Hypothyroidism, unspecified: Secondary | ICD-10-CM

## 2021-07-01 DIAGNOSIS — E049 Nontoxic goiter, unspecified: Secondary | ICD-10-CM

## 2021-07-04 NOTE — Telephone Encounter (Signed)
Erroneous encounter. Please disregard.

## 2021-08-02 ENCOUNTER — Other Ambulatory Visit: Payer: Medicare Other

## 2021-09-24 ENCOUNTER — Ambulatory Visit
Admission: RE | Admit: 2021-09-24 | Discharge: 2021-09-24 | Disposition: A | Discharge status: Home / Self Care | Payer: Medicare Other | Source: Ambulatory Visit | Attending: Endocrinology | Admitting: Endocrinology

## 2021-09-24 DIAGNOSIS — E039 Hypothyroidism, unspecified: Secondary | ICD-10-CM

## 2021-09-24 DIAGNOSIS — E049 Nontoxic goiter, unspecified: Secondary | ICD-10-CM

## 2021-09-27 ENCOUNTER — Other Ambulatory Visit: Payer: Self-pay | Admitting: Endocrinology

## 2021-09-27 DIAGNOSIS — E049 Nontoxic goiter, unspecified: Secondary | ICD-10-CM

## 2022-04-29 ENCOUNTER — Ambulatory Visit: Payer: Medicare Other | Admitting: Orthopedic Surgery

## 2022-04-29 DIAGNOSIS — M5136 Other intervertebral disc degeneration, lumbar region: Secondary | ICD-10-CM | POA: Diagnosis not present

## 2022-04-29 MED ORDER — METHOCARBAMOL 500 MG PO TABS: 500.0000 mg | ORAL_TABLET | Freq: Four times a day (QID) | ORAL | 0 refills | Status: DC

## 2022-05-05 ENCOUNTER — Encounter: Payer: Self-pay | Admitting: Orthopedic Surgery
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
## 2022-05-21 ENCOUNTER — Ambulatory Visit
Admission: RE | Admit: 2022-05-21 | Discharge: 2022-05-21 | Disposition: A | Discharge status: Home / Self Care | Payer: Medicare Other | Source: Ambulatory Visit | Attending: Orthopedic Surgery | Admitting: Orthopedic Surgery

## 2022-05-21 DIAGNOSIS — M5136 Other intervertebral disc degeneration, lumbar region: Secondary | ICD-10-CM

## 2022-05-28 ENCOUNTER — Other Ambulatory Visit: Payer: Self-pay

## 2022-05-28 ENCOUNTER — Telehealth: Payer: Self-pay

## 2022-05-28 DIAGNOSIS — M5136 Other intervertebral disc degeneration, lumbar region: Secondary | ICD-10-CM

## 2022-05-28 NOTE — Telephone Encounter (Signed)
-----   Message from Newt Minion, MD sent at 05/23/2022  6:40 AM EST -----  have patient follow-up with Dr. Ernestina Patches for evaluation for epidural steroid injection. ----- Message ----- From: Interface, Rad Results In Sent: 05/22/2022  10:31 AM EST To: Newt Minion, MD

## 2022-05-28 NOTE — Telephone Encounter (Signed)
Called and sw pt to advise of below. Pt would like to proceed with ESI with FN this order was placed in chart.

## 2022-06-12 ENCOUNTER — Ambulatory Visit: Payer: Self-pay

## 2022-06-12 ENCOUNTER — Ambulatory Visit: Payer: Medicare Other | Admitting: Physical Medicine and Rehabilitation

## 2022-06-12 DIAGNOSIS — M5416 Radiculopathy, lumbar region: Secondary | ICD-10-CM

## 2022-06-12 MED ORDER — METHYLPREDNISOLONE ACETATE 80 MG/ML IJ SUSP
80.0000 mg | Freq: Once | INTRAMUSCULAR | Status: AC
  Administered 2022-06-12: 80 mg

## 2022-06-12 NOTE — Patient Instructions (Signed)

## 2022-06-12 NOTE — Progress Notes (Signed)
Functional Pain Scale - descriptive words and definitions  Distracting (5)    Aware of pain/able to complete some ADL's but limited by pain/sleep is affected and active distractions are only slightly useful. Moderate range order  Average Pain 8  Lower back pain with anterior radiculopathy to left knee.

## 2022-06-24 NOTE — Procedures (Signed)
Lumbar Epidural Steroid Injection - Interlaminar Approach with Fluoroscopic Guidance  Patient: [name]      Date of Birth: 04-May-1946 MRN: [id number] PCP: Antony Contras, MD      Visit Date: 06/12/2022   Universal Protocol:     Consent Given By: the patient  Position: PRONE  Additional Comments: Vital signs were monitored before and after the procedure. Patient was prepped and draped in the usual sterile fashion. The correct patient, procedure, and site was verified.   Injection Procedure Details:   Procedure diagnoses: Lumbar radiculopathy [M54.16]   Meds Administered:  Meds ordered this encounter  Medications   methylPREDNISolone acetate (DEPO-MEDROL) injection 80 mg     Laterality: Left  Location/Site:  L5-S1  Needle: 3.5 in., 20 ga. Tuohy  Needle Placement: Paramedian epidural  Findings:   -Comments: Excellent flow of contrast into the epidural space.  Procedure Details: Using a paramedian approach from the side mentioned above, the region overlying the inferior lamina was localized under fluoroscopic visualization and the soft tissues overlying this structure were infiltrated with 4 ml. of 1% Lidocaine without Epinephrine. The Tuohy needle was inserted into the epidural space using a paramedian approach.   The epidural space was localized using loss of resistance along with counter oblique bi-planar fluoroscopic views.  After negative aspirate for air, blood, and CSF, a 2 ml. volume of Isovue-250 was injected into the epidural space and the flow of contrast was observed. Radiographs were obtained for documentation purposes.    The injectate was administered into the level noted above.   Additional Comments:  No complications occurred Dressing: 2 x 2 sterile gauze and Band-Aid    Post-procedure details: Patient was observed during the procedure. Post-procedure instructions were reviewed.  Patient left the clinic in stable condition.

## 2022-06-24 NOTE — Progress Notes (Signed)
[name] - 77 y.o. female MRN [id number]  Date of birth: 04-17-1946  Office Visit Note: Visit Date: 06/12/2022 PCP: Antony Contras, MD Referred by: Antony Contras, MD  Subjective: Chief Complaint  Patient presents with   Lower Back - Pain   HPI:  [name] is a 77 y.o. female who comes in today at the request of Dr. Meridee Score for planned Left L5-S1 Lumbar Interlaminar epidural steroid injection with fluoroscopic guidance.  The patient has failed conservative care including home exercise, medications, time and activity modification.  This injection will be diagnostic and hopefully therapeutic.  Please see requesting physician notes for further details and justification.   ROS Otherwise per HPI.  Assessment & Plan: Visit Diagnoses:    ICD-10-CM   1. Lumbar radiculopathy  M54.16 XR C-ARM NO REPORT    Epidural Steroid injection    methylPREDNISolone acetate (DEPO-MEDROL) injection 80 mg      Plan: No additional findings.   Meds & Orders:  Meds ordered this encounter  Medications   methylPREDNISolone acetate (DEPO-MEDROL) injection 80 mg    Orders Placed This Encounter  Procedures   XR C-ARM NO REPORT   Epidural Steroid injection    Follow-up: Return for visit to requesting provider as needed.   Procedures: No procedures performed  Lumbar Epidural Steroid Injection - Interlaminar Approach with Fluoroscopic Guidance  Patient: [name]      Date of Birth: Oct 27, 1945 MRN: [id number] PCP: Antony Contras, MD      Visit Date: 06/12/2022   Universal Protocol:     Consent Given By: the patient  Position: PRONE  Additional Comments: Vital signs were monitored before and after the procedure. Patient was prepped and draped in the usual sterile fashion. The correct patient, procedure, and site was verified.   Injection Procedure Details:   Procedure diagnoses: Lumbar radiculopathy [M54.16]   Meds Administered:  Meds ordered this encounter   Medications   methylPREDNISolone acetate (DEPO-MEDROL) injection 80 mg     Laterality: Left  Location/Site:  L5-S1  Needle: 3.5 in., 20 ga. Tuohy  Needle Placement: Paramedian epidural  Findings:   -Comments: Excellent flow of contrast into the epidural space.  Procedure Details: Using a paramedian approach from the side mentioned above, the region overlying the inferior lamina was localized under fluoroscopic visualization and the soft tissues overlying this structure were infiltrated with 4 ml. of 1% Lidocaine without Epinephrine. The Tuohy needle was inserted into the epidural space using a paramedian approach.   The epidural space was localized using loss of resistance along with counter oblique bi-planar fluoroscopic views.  After negative aspirate for air, blood, and CSF, a 2 ml. volume of Isovue-250 was injected into the epidural space and the flow of contrast was observed. Radiographs were obtained for documentation purposes.    The injectate was administered into the level noted above.   Additional Comments:  No complications occurred Dressing: 2 x 2 sterile gauze and Band-Aid    Post-procedure details: Patient was observed during the procedure. Post-procedure instructions were reviewed.  Patient left the clinic in stable condition.   Clinical History: MRI LUMBAR SPINE WITHOUT CONTRAST   TECHNIQUE: Multiplanar, multisequence MR imaging of the lumbar spine was performed. No intravenous contrast was administered.   COMPARISON:  Lumbar radiographs 08/02/2014.   Previous abdomen MRI 07/30/2009.   FINDINGS: Segmentation:  Normal on the comparison.   Alignment: Moderate chronic levoconvex lumbar scoliosis. Straightening of lumbar lordosis has not significantly changed since 2016. No  significant spondylolisthesis.   Vertebrae: Mild chronic L2 compression is stable since 2016. Chronic lumbar degenerative endplate spurring and chronic degenerative marrow signal  changes. Background bone marrow signal within normal limits. No convincing marrow edema or evidence of acute osseous abnormality. Intact visible sacrum and SI joints.   Conus medullaris and cauda equina: Abnormal visible lower thoracic spinal cord. Elongated and eccentric cystic area within the lower cord (series 5, image 10) relatively spares the conus. Conus is at L1. This does not have the typical appearance of ventriculus terminalis, more resemble syrinx, and in retrospect can be identified throughout the visible thoracic spinal cord on the 2011 abdomen exam.   Cauda equina nerve roots are within normal limits aside from degenerative changes detailed below.   Paraspinal and other soft tissues: Chronic bile duct and pancreatic ductal dilatation appears stable since 2011. Otherwise negative visible abdominal viscera. Negative lumbar paraspinal soft tissues.   Disc levels:   T11-T12: Disc desiccation. Partially visible small central disc extrusion (series 5, image 10). No stenosis.   T12-L1:  Negative.   L1-L2:  Disc desiccation and disc bulging.  No stenosis.   L2-L3: Disc desiccation and disc bulging with broad-based rightward component. Mild facet and ligament flavum hypertrophy. No spinal stenosis. Mild right lateral recess stenosis (descending right L3 nerve level), and moderate right L2 neural foraminal stenosis.   L3-L4: Disc desiccation and right eccentric disc bulging. Mild facet and ligament flavum hypertrophy. No spinal stenosis. Mild right lateral recess (right L4 nerve) and right foraminal (right L3 nerve) stenosis.   L4-L5: Subtle anterolisthesis. Disc space loss with left eccentric circumferential disc bulge and superimposed small central disc protrusion (series 13, image 29). Vacuum disc. Moderate facet and ligament flavum hypertrophy. No significant spinal stenosis. Up to moderate left lateral recess stenosis (left L5 nerve level). And moderate to severe  left L4 neural foraminal stenosis (series 5, image 13).   L5-S1: Disc space loss. Vacuum disc. Circumferential disc osteophyte complex eccentric to the left. Only mild facet hypertrophy. No spinal stenosis. Mild left lateral recess stenosis (left S1 nerve level). Moderate to severe left L5 foraminal stenosis (series 5, image 13).   IMPRESSION: 1. 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   2. Chronic levoconvex lumbar scoliosis with advanced disc and endplate degeneration G1-W2 through L5-S1. No significant lumbar spinal stenosis. But up to moderate left lateral recess stenosis at the left L5 nerve level, and moderate to severe left L4 and L5 neural foraminal stenosis. Up to moderate right L2 neural foraminal stenosis.     Electronically Signed   By: Genevie Ann M.D.   On: 05/22/2022 10:28     Objective:  VS:  HT:    WT:   BMI:     BP:   HR: bpm  TEMP: ( )  RESP:  Physical Exam Vitals and nursing note reviewed.  Constitutional:      General: She is not in acute distress.    Appearance: Normal appearance. She is not ill-appearing.  HENT:     Head: Normocephalic and atraumatic.     Right Ear: External ear normal.     Left Ear: External ear normal.  Eyes:     Extraocular Movements: Extraocular movements intact.  Cardiovascular:     Rate and Rhythm: Normal rate.     Pulses: Normal pulses.  Pulmonary:     Effort: Pulmonary effort is normal. No respiratory distress.  Abdominal:     General: There is no distension.  Palpations: Abdomen is soft.  Musculoskeletal:        General: Tenderness present.     Cervical back: Neck supple.     Right lower leg: No edema.     Left lower leg: No edema.     Comments: Patient has good distal strength with no pain over the greater trochanters.  No clonus or focal weakness.  Skin:    Findings: No erythema, lesion or rash.  Neurological:     General: No focal deficit present.     Mental Status: She is alert and  oriented to person, place, and time.     Sensory: No sensory deficit.     Motor: No weakness or abnormal muscle tone.     Coordination: Coordination normal.  Psychiatric:        Mood and Affect: Mood normal.        Behavior: Behavior normal.      Imaging: No results found.

## 2022-11-29 ENCOUNTER — Other Ambulatory Visit: Payer: Self-pay

## 2022-11-29 ENCOUNTER — Emergency Department (HOSPITAL_COMMUNITY): Payer: Medicare Other

## 2022-11-29 ENCOUNTER — Emergency Department (HOSPITAL_COMMUNITY)
Admission: EM | Admit: 2022-11-29 | Discharge: 2022-11-29 | Disposition: A | Discharge status: Home / Self Care | Payer: Medicare Other | Attending: Emergency Medicine | Admitting: Emergency Medicine

## 2022-11-29 DIAGNOSIS — Z91040 Latex allergy status: Secondary | ICD-10-CM | POA: Insufficient documentation

## 2022-11-29 DIAGNOSIS — K51911 Ulcerative colitis, unspecified with rectal bleeding: Secondary | ICD-10-CM | POA: Diagnosis not present

## 2022-11-29 DIAGNOSIS — K625 Hemorrhage of anus and rectum: Secondary | ICD-10-CM | POA: Diagnosis present

## 2022-11-29 LAB — COMPREHENSIVE METABOLIC PANEL
ALT: 16 U/L (ref 0–44)
AST: 19 U/L (ref 15–41)
Albumin: 3.8 g/dL (ref 3.5–5.0)
Alkaline Phosphatase: 60 U/L (ref 38–126)
Anion gap: 10 (ref 5–15)
BUN: 13 mg/dL (ref 8–23)
CO2: 26 mmol/L (ref 22–32)
Calcium: 9.8 mg/dL (ref 8.9–10.3)
Chloride: 104 mmol/L (ref 98–111)
Creatinine, Ser: 0.71 mg/dL (ref 0.44–1.00)
GFR, Estimated: >60 mL/min (ref >60)
Glucose, Bld: 123 mg/dL — ABNORMAL HIGH (ref 70–99)
Potassium: 3.2 mmol/L — ABNORMAL LOW (ref 3.5–5.1)
Sodium: 140 mmol/L (ref 135–145)
Total Bilirubin: 0.7 mg/dL (ref 0.3–1.2)
Total Protein: 8.1 g/dL (ref 6.5–8.1)

## 2022-11-29 LAB — CBC WITH DIFFERENTIAL/PLATELET
Abs Immature Granulocytes: 0.03 10*3/uL (ref 0.00–0.07)
Basophils Absolute: 0.1 10*3/uL (ref 0.0–0.1)
Basophils Relative: 1 %
Eosinophils Absolute: 0 10*3/uL (ref 0.0–0.5)
Eosinophils Relative: 0 %
HCT: 40.1 % (ref 36.0–46.0)
Hemoglobin: 12.8 g/dL (ref 12.0–15.0)
Immature Granulocytes: 0 %
Lymphocytes Relative: 29 %
Lymphs Abs: 2.1 10*3/uL (ref 0.7–4.0)
MCH: 28.2 pg (ref 26.0–34.0)
MCHC: 31.9 g/dL (ref 30.0–36.0)
MCV: 88.3 fL (ref 80.0–100.0)
Monocytes Absolute: 1.2 10*3/uL — ABNORMAL HIGH (ref 0.1–1.0)
Monocytes Relative: 18 %
Neutro Abs: 3.6 10*3/uL (ref 1.7–7.7)
Neutrophils Relative %: 52 %
Platelets: 441 10*3/uL — ABNORMAL HIGH (ref 150–400)
RBC: 4.54 MIL/uL (ref 3.87–5.11)
RDW: 13.2 % (ref 11.5–15.5)
WBC: 7 10*3/uL (ref 4.0–10.5)
nRBC: 0 % (ref 0.0–0.2)

## 2022-11-29 LAB — MAGNESIUM: Magnesium: 2 mg/dL (ref 1.7–2.4)

## 2022-11-29 MED ORDER — LACTATED RINGERS IV BOLUS
500.0000 mL | Freq: Once | INTRAVENOUS | Status: AC
  Administered 2022-11-29: 500 mL via INTRAVENOUS

## 2022-11-29 MED ORDER — PREDNISONE 20 MG PO TABS: 40.0000 mg | ORAL_TABLET | Freq: Every day | ORAL | 0 refills | Status: AC

## 2022-11-29 MED ORDER — POTASSIUM CHLORIDE CRYS ER 20 MEQ PO TBCR
40.0000 meq | EXTENDED_RELEASE_TABLET | Freq: Once | ORAL | Status: AC
  Administered 2022-11-29: 40 meq via ORAL
  Filled 2022-11-29: qty 2

## 2022-11-29 NOTE — Discharge Instructions (Addendum)
STOP the BUDESONIDE. You are being prescribed a different steroid, Prednisone.  Call Dr. Dulce Sellar on 7/8.  If you develop fever, abdominal pain, worsening blood in the stool, or any other new/concerning symptoms then return to the ER or call 911.

## 2022-11-29 NOTE — ED Triage Notes (Signed)
Patient reports bloody stools/rectal bleeding this week , history of ulcerative colitis .

## 2022-11-29 NOTE — ED Notes (Signed)
Discharge instructions reviewed with patient. Patient questions answered and opportunity for education reviewed. Patient voices understanding of discharge instructions with no further questions. Patient to lobby via wheelchair. 

## 2022-11-29 NOTE — ED Provider Notes (Signed)
[facility] AT [facility] Provider Note   CSN: [id number] Arrival date & time: 11/29/22  1944     History  Chief Complaint  Patient presents with   Hematochezia    Hx. Ulcerative Colitis    [name] is a 77 y.o. female.  HPI 77 year old female with a history of Ulcerative Colitis presents with rectal bleeding. Overall has been ongoing for about 2 weeks. Was at it's worst 5 days ago. By this she means she was having the largest amount of blood per rectum that day. Otherwise she's been having 3-4 episodes per day. Dr. Dulce Sellar prescribed Budesonide, and she was told to go to the ER if it was not better. She continues to have the bleeding. Has some abdominal fullness whenever she needs to go to the bathroom, but otherwise no abdominal pain. No vomiting or fevers.   Home Medications Prior to Admission medications   Medication Sig Start Date End Date Taking? Authorizing Provider  amLODipine (NORVASC) 10 MG tablet TAKE 1 TABLET (10 MG TOTAL) BY MOUTH DAILY. Patient taking differently: Take 10 mg by mouth daily. 07/10/15  Yes Collene Gobble, MD  cetirizine (ZYRTEC) 10 MG tablet Take 10 mg by mouth daily as needed for allergies.   Yes [provider]  ezetimibe (ZETIA) 10 MG tablet Take 1 tablet (10 mg total) by mouth daily. 06/15/15  Yes Collene Gobble, MD  hydrochlorothiazide (HYDRODIURIL) 25 MG tablet TAKE 1 TABLET (25 MG TOTAL) BY MOUTH DAILY. Patient taking differently: Take 25 mg by mouth daily. 07/10/15  Yes Collene Gobble, MD  levothyroxine (SYNTHROID, LEVOTHROID) 100 MCG tablet Take 100 mcg by mouth daily before breakfast. 11/19/17  Yes [provider]  losartan (COZAAR) 100 MG tablet Take 100 mg by mouth daily. 01/18/18  Yes [provider]  mesalamine (APRISO) 0.375 g 24 hr capsule Take 1 capsule (0.375 g total) by mouth daily. 06/17/18  Yes Serena Croissant, MD  montelukast (SINGULAIR) 10 MG tablet TAKE 1 TABLET (10 MG  TOTAL) BY MOUTH AT BEDTIME. Patient taking differently: Take 10 mg by mouth at bedtime. 06/19/15  Yes Jeffery, Chelle, PA  omeprazole (PRILOSEC) 20 MG capsule Take 1 capsule (20 mg total) by mouth daily. 06/17/18  Yes Serena Croissant, MD  potassium chloride (KLOR-CON 10) 10 MEQ tablet Take 2 tablets (20 mEq total) by mouth daily. Patient taking differently: Take 10 mEq by mouth daily. 01/04/16  Yes Collene Gobble, MD  predniSONE (DELTASONE) 20 MG tablet Take 2 tablets (40 mg total) by mouth daily. 11/29/22 12/29/22 Yes Pricilla Loveless, MD  albuterol (PROVENTIL HFA;VENTOLIN HFA) 108 (90 Base) MCG/ACT inhaler Inhale 1 puff into the lungs every 6 (six) hours as needed for wheezing or shortness of breath. 02/13/16   Collene Gobble, MD      Allergies    Contrast media [iodinated contrast media], Latex, Lipitor [atorvastatin], and Silvadene [silver sulfadiazine]    Review of Systems   Review of Systems  Respiratory:  Negative for shortness of breath.   Cardiovascular:  Negative for chest pain.  Gastrointestinal:  Positive for blood in stool. Negative for abdominal pain, rectal pain and vomiting.  Neurological:  Negative for light-headedness.    Physical Exam Updated Vital Signs BP (!) 146/77   Pulse 98   Temp 98.1 F (36.7 C) (Oral)   Resp 18   SpO2 98%  Physical Exam Vitals and nursing note reviewed.  Constitutional:      General:  She is not in acute distress.    Appearance: She is well-developed. She is not ill-appearing or diaphoretic.  HENT:     Head: Normocephalic and atraumatic.  Cardiovascular:     Rate and Rhythm: Normal rate and regular rhythm.     Heart sounds: Normal heart sounds.  Pulmonary:     Effort: Pulmonary effort is normal.     Breath sounds: Normal breath sounds.  Abdominal:     Palpations: Abdomen is soft.     Tenderness: There is no abdominal tenderness.  Skin:    General: Skin is warm and dry.  Neurological:     Mental Status: She is alert.     ED Results /  Procedures / Treatments   Labs (all labs ordered are listed, but only abnormal results are displayed) Labs Reviewed  COMPREHENSIVE METABOLIC PANEL - Abnormal; Notable for the following components:      Result Value   Potassium 3.2 (*)    Glucose, Bld 123 (*)    All other components within normal limits  CBC WITH DIFFERENTIAL/PLATELET - Abnormal; Notable for the following components:   Platelets 441 (*)    Monocytes Absolute 1.2 (*)    All other components within normal limits  MAGNESIUM    EKG None  Radiology DG Abdomen 1 View  Result Date: 11/29/2022 CLINICAL DATA:  Diarrhea EXAM: ABDOMEN - 1 VIEW COMPARISON:  None Available. FINDINGS: The bowel gas pattern is normal. No radio-opaque calculi or other significant radiographic abnormality are seen. IMPRESSION: Negative. Electronically Signed   By: Deatra Robinson M.D.   On: 11/29/2022 22:51    Procedures Procedures    Medications Ordered in ED Medications  potassium chloride SA (KLOR-CON M) CR tablet 40 mEq (has no administration in time range)  lactated ringers bolus 500 mL (500 mLs Intravenous New Bag/Given 11/29/22 2159)    ED Course/ Medical Decision Making/ A&P                             Medical Decision Making Amount and/or Complexity of Data Reviewed Labs: ordered.    Details: Normal WBC and hemoglobin Radiology: ordered and independent interpretation performed.    Details: No megacolon  Risk Prescription drug management.   Patient presents with hematochezia in the setting of ulcerative colitis.  No abdominal pain.  Normal WBC, no fevers or systemic symptoms.  Discussed with Dr. Bosie Clos, given the negative workup.  He is recommending to switch her from budesonide to prednisone.  Recommends giving 1 month supply of 40 mg daily and GI will help manage as an outpatient.  He will pass this along to her GI doctor, Dr. Dulce Sellar.  Otherwise he recommends getting a screening x-ray to ensure no obvious megacolon, but can hold  off on CT.  X-ray is unremarkable as above.  Patient is feeling well and I think stable for outpatient management.  She knows to stop the budesonide and will go pick up the prednisone tonight.  We discussed return precautions.        Final Clinical Impression(s) / ED Diagnoses Final diagnoses:  Acute ulcerative colitis with rectal bleeding (HCC)    Rx / DC Orders ED Discharge Orders          Ordered    predniSONE (DELTASONE) 20 MG tablet  Daily        11/29/22 2227              Pricilla Loveless, MD  11/29/22 2304  

## 2023-02-19 ENCOUNTER — Encounter: Payer: Self-pay | Admitting: Podiatry

## 2023-02-19 ENCOUNTER — Ambulatory Visit (INDEPENDENT_AMBULATORY_CARE_PROVIDER_SITE_OTHER): Payer: 59 | Admitting: Podiatry

## 2023-02-19 VITALS — BP 140/73 | HR 75

## 2023-02-19 DIAGNOSIS — M2170 Unequal limb length (acquired), unspecified site: Secondary | ICD-10-CM

## 2023-02-19 DIAGNOSIS — R2681 Unsteadiness on feet: Secondary | ICD-10-CM | POA: Diagnosis not present

## 2023-02-19 DIAGNOSIS — M779 Enthesopathy, unspecified: Secondary | ICD-10-CM | POA: Diagnosis not present

## 2023-02-19 NOTE — Progress Notes (Signed)
Subjective:   Patient ID: [name], female   DOB: 77 y.o.   MRN: [id number]   HPI Patient presents with pain in both feet long-term history of orthotics that have been beneficial but they are flattened out and no longer providing support after 5 years.  Patient states starting to notice more discomfort.  Patient does not smoke likes to be active   Review of Systems  All other systems reviewed and are negative.       Objective:  Physical Exam Vitals and nursing note reviewed.  Constitutional:      Appearance: She is well-developed.  Pulmonary:     Effort: Pulmonary effort is normal.  Musculoskeletal:        General: Normal range of motion.  Skin:    General: Skin is warm.  Neurological:     Mental Status: She is alert.     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  We will get this approved for her as she needs these for walking and structure

## 2023-03-05 NOTE — Progress Notes (Signed)
Requested prior auth on [facility] website for [id number] came back stating no auth required decision Ref # [id number]

## 2023-04-01 ENCOUNTER — Ambulatory Visit: Payer: Medicare Other

## 2023-04-29 ENCOUNTER — Encounter: Payer: Self-pay | Admitting: Podiatry

## 2023-05-01 NOTE — Progress Notes (Signed)
Orthotic eval   Patient was seen, measured / scanned for custom molded foot orthotics.  Patient will benefit from CFO's as they will help provide total contact to MLA's helping to better distribute body weight across BIL feet greater reducing plantar pressure and pain and to also encourage FF and RF alignment.  Patient was scanned items to be ordered and fit when in  [employer], CFo, CFm

## 2023-06-10 ENCOUNTER — Ambulatory Visit (INDEPENDENT_AMBULATORY_CARE_PROVIDER_SITE_OTHER): Payer: 59

## 2023-06-10 DIAGNOSIS — R2681 Unsteadiness on feet: Secondary | ICD-10-CM | POA: Diagnosis not present

## 2023-06-10 DIAGNOSIS — M2141 Flat foot [pes planus] (acquired), right foot: Secondary | ICD-10-CM | POA: Diagnosis not present

## 2023-06-10 DIAGNOSIS — M2170 Unequal limb length (acquired), unspecified site: Secondary | ICD-10-CM

## 2023-06-10 DIAGNOSIS — M2142 Flat foot [pes planus] (acquired), left foot: Secondary | ICD-10-CM

## 2023-06-10 DIAGNOSIS — M779 Enthesopathy, unspecified: Secondary | ICD-10-CM | POA: Diagnosis not present

## 2023-06-10 NOTE — Progress Notes (Addendum)
 Patient presents today to pick up custom molded foot orthotics, diagnosed with Tendonitis and gait instability by Dr. Celia Coles   Orthotics were dispensed and fit was satisfactory, however were not topped in plastizote as we planned, I told patient I would re-top in zote and ship next day patient Reviewed instructions for break-in and wear. Written instructions given to patient.  Patient will follow up as needed.  Britton Cane Cped, CFo, CFm  Ref# [employer] [id number]  No auth needed for this proc code  Document indexed date 12.12.2024

## 2023-06-20 ENCOUNTER — Encounter: Payer: Self-pay | Admitting: Podiatry

## 2023-07-29 ENCOUNTER — Encounter: Payer: Self-pay | Admitting: Podiatry

## 2023-07-30 NOTE — Telephone Encounter (Signed)
 Please show this to barb and lets get this rectified for her

## 2023-10-26 ENCOUNTER — Ambulatory Visit: Admitting: Podiatry

## 2023-10-26 ENCOUNTER — Encounter: Payer: Self-pay | Admitting: Podiatry

## 2023-10-26 VITALS — Ht 64.0 in | Wt 143.0 lb

## 2023-10-26 DIAGNOSIS — L03032 Cellulitis of left toe: Secondary | ICD-10-CM

## 2023-10-28 NOTE — Progress Notes (Signed)
 Subjective:   Patient ID: [name], female   DOB: 78 y.o.   MRN: [id number]   HPI Patient presents with a lot of pain around her big toenail left and redness in the corner.  States it has been sore hard to wear shoe gear with and she has tried to soak and trim it without relief neurovascular   ROS      Objective:  Physical Exam  Neurovascular status intact good digital perfusion with patient noted to have left hallux medial border redness and mild drainage present.  It is localized no proximal edema edema drainage noted     Assessment:  Paronychia infection left hallux medial side     Plan:  H&P reviewed I have recommended that we do a temporary procedure and reduce the inflammatory process and slight infection present.  Patient wants this done I anesthetized 60 mg Xylocaine  Marcaine  mixture sterile prep done using sterile instrumentation remove the medial border removed a small amount of distal necrotic flesh I flushed the area applied sterile dressing.  Patient will be seen back to recheck with instructions on soaks given today
